# Patient Record
Sex: Female | Born: 1985 | Race: White | Hispanic: No | Marital: Married | Smoking: Never smoker
Health system: Southern US, Community
[De-identification: ages and names within clinical notes are randomized; demographics above are authoritative.]

## PROBLEM LIST (undated history)

## (undated) DIAGNOSIS — Q513 Bicornate uterus: Secondary | ICD-10-CM

## (undated) DIAGNOSIS — F988 Other specified behavioral and emotional disorders with onset usually occurring in childhood and adolescence: Secondary | ICD-10-CM

## (undated) DIAGNOSIS — D649 Anemia, unspecified: Secondary | ICD-10-CM

## (undated) DIAGNOSIS — O24419 Gestational diabetes mellitus in pregnancy, unspecified control: Secondary | ICD-10-CM

## (undated) HISTORY — DX: Other specified behavioral and emotional disorders with onset usually occurring in childhood and adolescence: F98.8

## (undated) HISTORY — DX: Gestational diabetes mellitus in pregnancy, unspecified control: O24.419

## (undated) HISTORY — DX: Bicornate uterus: Q51.3

## (undated) HISTORY — PX: BREAST SURGERY: SHX581

## (undated) HISTORY — DX: Anemia, unspecified: D64.9

## (undated) HISTORY — PX: CHOLECYSTECTOMY: SHX55

---
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
## 2006-04-28 ENCOUNTER — Other Ambulatory Visit: Admission: RE | Admit: 2006-04-28 | Discharge: 2006-04-28 | Payer: Self-pay | Admitting: Family Medicine

## 2007-09-16 ENCOUNTER — Other Ambulatory Visit: Admission: RE | Admit: 2007-09-16 | Discharge: 2007-09-16 | Payer: Self-pay | Admitting: Family Medicine

## 2012-07-07 DIAGNOSIS — F419 Anxiety disorder, unspecified: Secondary | ICD-10-CM | POA: Insufficient documentation

## 2013-07-26 DIAGNOSIS — O09219 Supervision of pregnancy with history of pre-term labor, unspecified trimester: Secondary | ICD-10-CM | POA: Insufficient documentation

## 2013-09-04 ENCOUNTER — Ambulatory Visit (INDEPENDENT_AMBULATORY_CARE_PROVIDER_SITE_OTHER): Payer: 59 | Admitting: Physician Assistant

## 2013-09-04 VITALS — BP 100/66 | HR 88 | Temp 97.7°F | Resp 20 | Ht 62.5 in | Wt 198.0 lb

## 2013-09-04 DIAGNOSIS — R82998 Other abnormal findings in urine: Secondary | ICD-10-CM

## 2013-09-04 DIAGNOSIS — R8290 Unspecified abnormal findings in urine: Secondary | ICD-10-CM

## 2013-09-04 DIAGNOSIS — R350 Frequency of micturition: Secondary | ICD-10-CM

## 2013-09-04 LAB — POCT WET PREP WITH KOH
KOH Prep POC: NEGATIVE
RBC Wet Prep HPF POC: NEGATIVE
Trichomonas, UA: NEGATIVE
Yeast Wet Prep HPF POC: NEGATIVE

## 2013-09-04 LAB — POCT URINALYSIS DIPSTICK
Bilirubin, UA: NEGATIVE
Blood, UA: NEGATIVE
Glucose, UA: NEGATIVE
Ketones, UA: NEGATIVE
Nitrite, UA: NEGATIVE
Protein, UA: NEGATIVE
Spec Grav, UA: 1.015
Urobilinogen, UA: 2
pH, UA: 7.5

## 2013-09-04 LAB — POCT UA - MICROSCOPIC ONLY
Casts, Ur, LPF, POC: NEGATIVE
Crystals, Ur, HPF, POC: NEGATIVE
Mucus, UA: NEGATIVE
RBC, urine, microscopic: NEGATIVE
Yeast, UA: NEGATIVE

## 2013-09-04 MED ORDER — CEPHALEXIN 500 MG PO CAPS: 500.0000 mg | ORAL_CAPSULE | Freq: Two times a day (BID) | ORAL | Status: DC

## 2013-09-04 NOTE — Progress Notes (Signed)
Subjective:    Patient ID: Otis DialsLisa Speakman, female    DOB: 07/23/1985, 28 y.o.   MRN: [id number]  HPI   Ms. Lawernce IonCranford is a very pleasant 28 yr old female here with concern for UTI.  Symptoms began today - frequency, strong odor to urine, lower abd pain.  She denies dysuria or hematuria.  She has had UTIs in the past.  She took two home UTI tests which were both positive.  +nausea but no vomiting.  No FC.  She also notes some vaginal itching and increased vaginal discharge.  Monogamous with husband, no concern for STI.  She is [redacted] wks pregnant    Review of Systems  Constitutional: Negative for fever and chills.  Respiratory: Negative.   Gastrointestinal: Positive for nausea and abdominal pain (lower). Negative for vomiting.  Genitourinary: Positive for frequency and vaginal discharge. Negative for dysuria and flank pain.  Musculoskeletal: Negative.   Skin: Negative.        Objective:   Physical Exam  Vitals reviewed. Constitutional: She is oriented to person, place, and time. She appears well-developed and well-nourished. No distress.  HENT:  Head: Normocephalic and atraumatic.  Eyes: Conjunctivae are normal. No scleral icterus.  Cardiovascular: Normal rate, regular rhythm and normal heart sounds.   Pulmonary/Chest: Effort normal and breath sounds normal. She has no wheezes. She has no rales.  Abdominal: There is no CVA tenderness.  Neurological: She is alert and oriented to person, place, and time.  Skin: Skin is warm and dry.  Psychiatric: She has a normal mood and affect. Her behavior is normal.    Results for orders placed in visit on 09/04/13  POCT URINALYSIS DIPSTICK      Result Value Ref Range   Color, UA yellow     Clarity, UA cloudy     Glucose, UA neg     Bilirubin, UA neg     Ketones, UA neg     Spec Grav, UA 1.015     Blood, UA neg     pH, UA 7.5     Protein, UA neg     Urobilinogen, UA 2.0     Nitrite, UA neg     Leukocytes, UA Trace    POCT UA - MICROSCOPIC  ONLY      Result Value Ref Range   WBC, Ur, HPF, POC 0-3     RBC, urine, microscopic neg     Bacteria, U Microscopic trace     Mucus, UA neg     Epithelial cells, urine per micros 1-3     Crystals, Ur, HPF, POC neg     Casts, Ur, LPF, POC neg     Yeast, UA neg    POCT WET PREP WITH KOH      Result Value Ref Range   Trichomonas, UA Negative     Clue Cells Wet Prep HPF POC 0-3     Epithelial Wet Prep HPF POC 1-6     Yeast Wet Prep HPF POC neg     Bacteria Wet Prep HPF POC 1+     RBC Wet Prep HPF POC neg     WBC Wet Prep HPF POC 2-6     KOH Prep POC Negative         Assessment & Plan:  Urinary frequency - Plan: POCT Urinalysis Dipstick, POCT UA - Microscopic Only, POCT Wet Prep with KOH, Urine culture, cephALEXin (KEFLEX) 500 MG capsule  Abnormal urine odor - Plan: POCT Wet Prep with  KOH, Urine culture, cephALEXin (KEFLEX) 500 MG capsule    Ms. Douse is a very pleasant 28 yr old female here with urinary frequency and abnormal urine odor which began today.  Concerned for UTI as two home tests were positive.  UA here is not very impressive for infection, but symptoms just began today.  Self collected wet prep with few clues but otherwise unimpressive.  Will go ahead and treat empirically with Keflex for UTI.  Cx pending - will adjust or d/c therapy when results available  Pt to call or RTC if worsening or not improving  E. Frances Furbish MHS, PA-C [facility] 7/11/20155:16 PM

## 2013-09-04 NOTE — Patient Instructions (Signed)
Start the cephalexin (Keflex) today.  I will let you know when your culture results are back.    If any symptoms are worsening or not improving, please let us  Know   Urinary Tract Infection Urinary tract infections (UTIs) can develop anywhere along your urinary tract. Your urinary tract is your body's drainage system for removing wastes and extra water. Your urinary tract includes two kidneys, two ureters, a bladder, and a urethra. Your kidneys are a pair of bean-shaped organs. Each kidney is about the size of your fist. They are located below your ribs, one on each side of your spine. CAUSES Infections are caused by microbes, which are microscopic organisms, including fungi, viruses, and bacteria. These organisms are so small that they can only be seen through a microscope. Bacteria are the microbes that most commonly cause UTIs. SYMPTOMS  Symptoms of UTIs may vary by age and gender of the patient and by the location of the infection. Symptoms in young women typically include a frequent and intense urge to urinate and a painful, burning feeling in the bladder or urethra during urination. Older women and men are more likely to be tired, shaky, and weak and have muscle aches and abdominal pain. A fever may mean the infection is in your kidneys. Other symptoms of a kidney infection include pain in your back or sides below the ribs, nausea, and vomiting. DIAGNOSIS To diagnose a UTI, your caregiver will ask you about your symptoms. Your caregiver also will ask to provide a urine sample. The urine sample will be tested for bacteria and white blood cells. White blood cells are made by your body to help fight infection. TREATMENT  Typically, UTIs can be treated with medication. Because most UTIs are caused by a bacterial infection, they usually can be treated with the use of antibiotics. The choice of antibiotic and length of treatment depend on your symptoms and the type of bacteria causing your  infection. HOME CARE INSTRUCTIONS  If you were prescribed antibiotics, take them exactly as your caregiver instructs you. Finish the medication even if you feel better after you have only taken some of the medication.  Drink enough water and fluids to keep your urine clear or pale yellow.  Avoid caffeine, tea, and carbonated beverages. They tend to irritate your bladder.  Empty your bladder often. Avoid holding urine for long periods of time.  Empty your bladder before and after sexual intercourse.  After a bowel movement, women should cleanse from front to back. Use each tissue only once. SEEK MEDICAL CARE IF:   You have back pain.  You develop a fever.  Your symptoms do not begin to resolve within 3 days. SEEK IMMEDIATE MEDICAL CARE IF:   You have severe back pain or lower abdominal pain.  You develop chills.  You have nausea or vomiting.  You have continued burning or discomfort with urination. MAKE SURE YOU:   Understand these instructions.  Will watch your condition.  Will get help right away if you are not doing well or get worse. Document Released: 11/21/2004 Document Revised: 08/13/2011 Document Reviewed: 03/22/2011 [facility] Patient Information 2015 [place]. This information is not intended to replace advice given to you by your health care provider. Make sure you discuss any questions you have with your health care provider.

## 2013-09-06 LAB — URINE CULTURE: Colony Count: 40000

## 2013-11-08 DIAGNOSIS — O2343 Unspecified infection of urinary tract in pregnancy, third trimester: Secondary | ICD-10-CM | POA: Insufficient documentation

## 2014-02-21 DIAGNOSIS — O3403 Maternal care for unspecified congenital malformation of uterus, third trimester: Secondary | ICD-10-CM | POA: Insufficient documentation

## 2014-02-21 DIAGNOSIS — Q513 Bicornate uterus: Secondary | ICD-10-CM | POA: Insufficient documentation

## 2015-06-05 LAB — HEPATIC FUNCTION PANEL
ALT: 26 (ref 7–35)
AST: 22 (ref 13–35)

## 2015-06-05 LAB — VITAMIN D 25 HYDROXY (VIT D DEFICIENCY, FRACTURES): Vit D, 25-Hydroxy: 35.6

## 2015-07-03 LAB — BASIC METABOLIC PANEL
BUN: 7 (ref 4–21)
Creatinine: 0.6 (ref 0.5–1.1)
Glucose: 87
Potassium: 4.6 (ref 3.4–5.3)
Sodium: 142 (ref 137–147)

## 2015-07-03 LAB — CBC AND DIFFERENTIAL
HCT: 37 (ref 36–46)
Hemoglobin: 12.2 (ref 12.0–16.0)
Platelets: 290 (ref 150–399)
WBC: 8.8

## 2015-07-03 LAB — HEPATIC FUNCTION PANEL
ALT: 37 — AB (ref 7–35)
AST: 22 (ref 13–35)
Alkaline Phosphatase: 57 (ref 25–125)
Bilirubin, Total: 0.2

## 2016-05-31 LAB — HEPATIC FUNCTION PANEL
ALT: 37 — AB (ref 7–35)
AST: 29 (ref 13–35)
Alkaline Phosphatase: 62 (ref 25–125)
Bilirubin, Total: 0.2

## 2016-05-31 LAB — BASIC METABOLIC PANEL
BUN: 13 (ref 4–21)
Creatinine: 0.7 (ref 0.5–1.1)
Glucose: 90
Potassium: 4.1 (ref 3.4–5.3)
Sodium: 140 (ref 137–147)

## 2016-05-31 LAB — LIPID PANEL
Cholesterol: 144 (ref 0–200)
HDL: 39 (ref 35–70)
LDL Cholesterol: 67
Triglycerides: 188 — AB (ref 40–160)

## 2016-05-31 LAB — CBC AND DIFFERENTIAL
HCT: 38 (ref 36–46)
Hemoglobin: 12.6 (ref 12.0–16.0)
Platelets: 339 (ref 150–399)
WBC: 10.6

## 2017-08-01 ENCOUNTER — Telehealth: Payer: Self-pay | Admitting: Obstetrics and Gynecology

## 2017-08-01 ENCOUNTER — Encounter: Payer: Self-pay | Admitting: Family Medicine

## 2017-08-01 ENCOUNTER — Ambulatory Visit (INDEPENDENT_AMBULATORY_CARE_PROVIDER_SITE_OTHER): Payer: BLUE CROSS/BLUE SHIELD | Admitting: Family Medicine

## 2017-08-01 VITALS — BP 121/80 | HR 89 | Ht 62.0 in | Wt 205.0 lb

## 2017-08-01 DIAGNOSIS — E559 Vitamin D deficiency, unspecified: Secondary | ICD-10-CM

## 2017-08-01 DIAGNOSIS — Z803 Family history of malignant neoplasm of breast: Secondary | ICD-10-CM | POA: Diagnosis not present

## 2017-08-01 DIAGNOSIS — N883 Incompetence of cervix uteri: Secondary | ICD-10-CM | POA: Insufficient documentation

## 2017-08-01 DIAGNOSIS — E669 Obesity, unspecified: Secondary | ICD-10-CM

## 2017-08-01 DIAGNOSIS — F39 Unspecified mood [affective] disorder: Secondary | ICD-10-CM | POA: Insufficient documentation

## 2017-08-01 DIAGNOSIS — F909 Attention-deficit hyperactivity disorder, unspecified type: Secondary | ICD-10-CM | POA: Diagnosis not present

## 2017-08-01 DIAGNOSIS — Z8249 Family history of ischemic heart disease and other diseases of the circulatory system: Secondary | ICD-10-CM

## 2017-08-01 DIAGNOSIS — Z975 Presence of (intrauterine) contraceptive device: Secondary | ICD-10-CM

## 2017-08-01 DIAGNOSIS — Z8632 Personal history of gestational diabetes: Secondary | ICD-10-CM | POA: Insufficient documentation

## 2017-08-01 DIAGNOSIS — E66812 Obesity, class 2: Secondary | ICD-10-CM

## 2017-08-01 HISTORY — DX: Incompetence of cervix uteri: N88.3

## 2017-08-01 MED ORDER — AMPHETAMINE-DEXTROAMPHETAMINE 15 MG PO TABS: ORAL_TABLET | ORAL | 0 refills | Status: DC

## 2017-08-01 MED ORDER — VITAMIN D (ERGOCALCIFEROL) 1.25 MG (50000 UNIT) PO CAPS: ORAL_CAPSULE | ORAL | 10 refills | Status: DC

## 2017-08-01 MED ORDER — ADDERALL XR 30 MG PO CP24: 30.0000 mg | ORAL_CAPSULE | Freq: Every day | ORAL | 0 refills | Status: DC

## 2017-08-01 MED ORDER — BUPROPION HCL ER (XL) 150 MG PO TB24: 150.0000 mg | ORAL_TABLET | Freq: Every day | ORAL | 0 refills | Status: DC

## 2017-08-01 NOTE — Patient Instructions (Addendum)
For more information you can go to:  [email or web address].fi  [email or web address].org/      Mediterranean Diet  Why follow it? Research shows. . Those who follow the Mediterranean diet have a reduced risk of heart disease  . The diet is associated with a reduced incidence of Parkinson's and Alzheimer's diseases . People following the diet may have longer life expectancies and lower rates of chronic diseases  . The Dietary Guidelines for Americans recommends the Mediterranean diet as an eating plan to promote health and prevent disease  What Is the Mediterranean Diet?  . Healthy eating plan based on typical foods and recipes of Mediterranean-style cooking . The diet is primarily a plant based diet; these foods should make up a majority of meals   Starches - Plant based foods should make up a majority of meals - They are an important sources of vitamins, minerals, energy, antioxidants, and fiber - Choose whole grains, foods high in fiber and minimally processed items  - Typical grain sources include wheat, oats, barley, corn, brown rice, bulgar, farro, millet, polenta, couscous  - Various types of beans include chickpeas, lentils, fava beans, black beans, white beans   Fruits  Veggies - Large quantities of antioxidant rich fruits & veggies; 6 or more servings  - Vegetables can be eaten raw or lightly drizzled with oil and cooked  - Vegetables common to the traditional Mediterranean Diet include: artichokes, arugula, beets, broccoli, brussel sprouts, cabbage, carrots, celery, collard greens, cucumbers, eggplant, kale, leeks, lemons, lettuce, mushrooms, okra, onions, peas, peppers, potatoes, pumpkin, radishes, rutabaga, shallots, spinach, sweet potatoes, turnips, zucchini - Fruits common to the Mediterranean Diet include: apples, apricots, avocados, cherries, clementines, dates, figs, grapefruits, grapes, melons, nectarines, oranges, peaches, pears, pomegranates,  strawberries, tangerines  Fats - Replace butter and margarine with healthy oils, such as olive oil, canola oil, and tahini  - Limit nuts to no more than a handful a day  - Nuts include walnuts, almonds, pecans, pistachios, pine nuts  - Limit or avoid candied, honey roasted or heavily salted nuts - Olives are central to the [employer] - can be eaten whole or used in a variety of dishes   Meats Protein - Limiting red meat: no more than a few times a month - When eating red meat: choose lean cuts and keep the portion to the size of deck of cards - Eggs: approx. 0 to 4 times a week  - Fish and lean poultry: at least 2 a week  - Healthy protein sources include, chicken, [place], lean beef, lamb - Increase intake of seafood such as tuna, salmon, trout, mackerel, shrimp, scallops - Avoid or limit high fat processed meats such as sausage and bacon  Dairy - Include moderate amounts of low fat dairy products  - Focus on healthy dairy such as fat free yogurt, skim milk, low or reduced fat cheese - Limit dairy products higher in fat such as whole or 2% milk, cheese, ice cream  Alcohol - Moderate amounts of red wine is ok  - No more than 5 oz daily for women (all ages) and men older than age 71  - No more than 10 oz of wine daily for men younger than 10  Other - Limit sweets and other desserts  - Use herbs and spices instead of salt to flavor foods  - Herbs and spices common to the traditional Mediterranean Diet include: basil, bay leaves, chives, cloves, cumin, fennel, garlic, lavender, marjoram, mint, oregano, parsley, pepper, rosemary, sage, savory,  sumac, tarragon, thyme   It's not just a diet, it's a lifestyle:  . The Mediterranean diet includes lifestyle factors typical of those in the region  . Foods, drinks and meals are best eaten with others and savored . Daily physical activity is important for overall good health . This could be strenuous exercise like running and aerobics . This  could also be more leisurely activities such as walking, housework, yard-work, or taking the stairs . Moderation is the key; a balanced and healthy diet accommodates most foods and drinks . Consider portion sizes and frequency of consumption of certain foods   Meal Ideas & Options:  . Breakfast:  o Whole wheat toast or whole wheat English muffins with peanut butter & hard boiled egg o Steel cut oats topped with apples & cinnamon and skim milk  o Fresh fruit: banana, strawberries, melon, berries, peaches  o Smoothies: strawberries, bananas, greek yogurt, peanut butter o Low fat greek yogurt with blueberries and granola  o Egg white omelet with spinach and mushrooms o Breakfast couscous: whole wheat couscous, apricots, skim milk, cranberries  . Sandwiches:  o Hummus and grilled vegetables (peppers, zucchini, squash) on whole wheat bread   o Grilled chicken on whole wheat pita with lettuce, tomatoes, cucumbers or tzatziki  o Tuna salad on whole wheat bread: tuna salad made with greek yogurt, olives, red peppers, capers, green onions o Garlic rosemary lamb pita: lamb sauted with garlic, rosemary, salt & pepper; add lettuce, cucumber, greek yogurt to pita - flavor with lemon juice and black pepper  . Seafood:  o Mediterranean grilled salmon, seasoned with garlic, basil, parsley, lemon juice and black pepper o Shrimp, lemon, and spinach whole-grain pasta salad made with low fat greek yogurt  o Seared scallops with lemon orzo  o Seared tuna steaks seasoned salt, pepper, coriander topped with tomato mixture of olives, tomatoes, olive oil, minced garlic, parsley, green onions and cappers  . Meats:  o Herbed greek chicken salad with kalamata olives, cucumber, feta  o Red bell peppers stuffed with spinach, bulgur, lean ground beef (or lentils) & topped with feta   o Kebabs: skewers of chicken, tomatoes, onions, zucchini, squash  o [place] burgers: made with red onions, mint, dill, lemon juice, feta  cheese topped with roasted red peppers . Vegetarian o Cucumber salad: cucumbers, artichoke hearts, celery, red onion, feta cheese, tossed in olive oil & lemon juice  o Hummus and whole grain pita points with a greek salad (lettuce, tomato, feta, olives, cucumbers, red onion) o Lentil soup with celery, carrots made with vegetable broth, garlic, salt and pepper  o Tabouli salad: parsley, bulgur, mint, scallions, cucumbers, tomato, radishes, lemon juice, olive oil, salt and pepper. Please realize, EXERCISE IS MEDICINE!  -  American Heart Association [facility]) guidelines for exercise : If you are in good health, without any medical conditions, you should engage in 150 minutes of moderate intensity aerobic activity per week.  This means you should be huffing and puffing throughout your workout.   Engaging in regular exercise will improve brain function and memory, as well as improve mood, boost immune system and help with weight management.  As well as the other, more well-known effects of exercise such as decreasing blood sugar levels, decreasing blood pressure,  and decreasing bad cholesterol levels/ increasing good cholesterol levels.     -  The AHA strongly endorses consumption of a diet that contains a variety of foods from all the food categories with an emphasis  on fruits and vegetables; fat-free and low-fat dairy products; cereal and grain products; legumes and nuts; and fish, poultry, and/or extra lean meats.    Excessive food intake, especially of foods high in saturated and trans fats, sugar, and salt, should be avoided.    Adequate water intake of roughly 1/2 of your weight in pounds, should equal the ounces of water per day you should drink.  So for instance, if you're 200 pounds, that would be 100 ounces of water per day.         Mediterranean Diet  Why follow it? Research shows. . Those who follow the Mediterranean diet have a reduced risk of heart disease  . The diet is associated with a  reduced incidence of Parkinson's and Alzheimer's diseases . People following the diet may have longer life expectancies and lower rates of chronic diseases  . The Dietary Guidelines for Americans recommends the Mediterranean diet as an eating plan to promote health and prevent disease  What Is the Mediterranean Diet?  . Healthy eating plan based on typical foods and recipes of Mediterranean-style cooking . The diet is primarily a plant based diet; these foods should make up a majority of meals   Starches - Plant based foods should make up a majority of meals - They are an important sources of vitamins, minerals, energy, antioxidants, and fiber - Choose whole grains, foods high in fiber and minimally processed items  - Typical grain sources include wheat, oats, barley, corn, brown rice, bulgar, farro, millet, polenta, couscous  - Various types of beans include chickpeas, lentils, fava beans, black beans, white beans   Fruits  Veggies - Large quantities of antioxidant rich fruits & veggies; 6 or more servings  - Vegetables can be eaten raw or lightly drizzled with oil and cooked  - Vegetables common to the traditional Mediterranean Diet include: artichokes, arugula, beets, broccoli, brussel sprouts, cabbage, carrots, celery, collard greens, cucumbers, eggplant, kale, leeks, lemons, lettuce, mushrooms, okra, onions, peas, peppers, potatoes, pumpkin, radishes, rutabaga, shallots, spinach, sweet potatoes, turnips, zucchini - Fruits common to the Mediterranean Diet include: apples, apricots, avocados, cherries, clementines, dates, figs, grapefruits, grapes, melons, nectarines, oranges, peaches, pears, pomegranates, strawberries, tangerines  Fats - Replace butter and margarine with healthy oils, such as olive oil, canola oil, and tahini  - Limit nuts to no more than a handful a day  - Nuts include walnuts, almonds, pecans, pistachios, pine nuts  - Limit or avoid candied, honey roasted or heavily salted  nuts - Olives are central to the [employer] - can be eaten whole or used in a variety of dishes   Meats Protein - Limiting red meat: no more than a few times a month - When eating red meat: choose lean cuts and keep the portion to the size of deck of cards - Eggs: approx. 0 to 4 times a week  - Fish and lean poultry: at least 2 a week  - Healthy protein sources include, chicken, [place], lean beef, lamb - Increase intake of seafood such as tuna, salmon, trout, mackerel, shrimp, scallops - Avoid or limit high fat processed meats such as sausage and bacon  Dairy - Include moderate amounts of low fat dairy products  - Focus on healthy dairy such as fat free yogurt, skim milk, low or reduced fat cheese - Limit dairy products higher in fat such as whole or 2% milk, cheese, ice cream  Alcohol - Moderate amounts of red wine is ok  - No  more than 5 oz daily for women (all ages) and men older than age 33  - No more than 10 oz of wine daily for men younger than 2965  Other - Limit sweets and other desserts  - Use herbs and spices instead of salt to flavor foods  - Herbs and spices common to the traditional Mediterranean Diet include: basil, bay leaves, chives, cloves, cumin, fennel, garlic, lavender, marjoram, mint, oregano, parsley, pepper, rosemary, sage, savory, sumac, tarragon, thyme   It's not just a diet, it's a lifestyle:  . The Mediterranean diet includes lifestyle factors typical of those in the region  . Foods, drinks and meals are best eaten with others and savored . Daily physical activity is important for overall good health . This could be strenuous exercise like running and aerobics . This could also be more leisurely activities such as walking, housework, yard-work, or taking the stairs . Moderation is the key; a balanced and healthy diet accommodates most foods and drinks . Consider portion sizes and frequency of consumption of certain foods   Meal Ideas & Options:   . Breakfast:  o Whole wheat toast or whole wheat English muffins with peanut butter & hard boiled egg o Steel cut oats topped with apples & cinnamon and skim milk  o Fresh fruit: banana, strawberries, melon, berries, peaches  o Smoothies: strawberries, bananas, greek yogurt, peanut butter o Low fat greek yogurt with blueberries and granola  o Egg white omelet with spinach and mushrooms o Breakfast couscous: whole wheat couscous, apricots, skim milk, cranberries  . Sandwiches:  o Hummus and grilled vegetables (peppers, zucchini, squash) on whole wheat bread   o Grilled chicken on whole wheat pita with lettuce, tomatoes, cucumbers or tzatziki  o Tuna salad on whole wheat bread: tuna salad made with greek yogurt, olives, red peppers, capers, green onions o Garlic rosemary lamb pita: lamb sauted with garlic, rosemary, salt & pepper; add lettuce, cucumber, greek yogurt to pita - flavor with lemon juice and black pepper  . Seafood:  o Mediterranean grilled salmon, seasoned with garlic, basil, parsley, lemon juice and black pepper o Shrimp, lemon, and spinach whole-grain pasta salad made with low fat greek yogurt  o Seared scallops with lemon orzo  o Seared tuna steaks seasoned salt, pepper, coriander topped with tomato mixture of olives, tomatoes, olive oil, minced garlic, parsley, green onions and cappers  . Meats:  o Herbed greek chicken salad with kalamata olives, cucumber, feta  o Red bell peppers stuffed with spinach, bulgur, lean ground beef (or lentils) & topped with feta   o Kebabs: skewers of chicken, tomatoes, onions, zucchini, squash  o [place] burgers: made with red onions, mint, dill, lemon juice, feta cheese topped with roasted red peppers . Vegetarian o Cucumber salad: cucumbers, artichoke hearts, celery, red onion, feta cheese, tossed in olive oil & lemon juice  o Hummus and whole grain pita points with a greek salad (lettuce, tomato, feta, olives, cucumbers, red onion) o Lentil  soup with celery, carrots made with vegetable broth, garlic, salt and pepper  o Tabouli salad: parsley, bulgur, mint, scallions, cucumbers, tomato, radishes, lemon juice, olive oil, salt and pepper.

## 2017-08-01 NOTE — Telephone Encounter (Signed)
Called and left a message for patient to call back to schedule a new patient doctor referral appointment with our office to see Dr. Hyacinth MeekerMiller for Dr. Oscar LaJertson for routine female care.

## 2017-08-01 NOTE — Progress Notes (Signed)
New patient office visit note:  Impression and Recommendations:    1. Attention deficit hyperactivity disorder (ADHD), unspecified ADHD type   2. Class 2 obesity with body mass index (BMI) of 35 to 39.9 without comorbidity   3. Vitamin D deficiency   4. Family history of early CAD   5. Family history of breast cancer-  paternal aunt in 1340s   6. IUD (intrauterine device) in place     1 ADHD  -Check labs in near future.   -start adderall 15 mg for evenings to use in addition to adderall 30 mg XR qd.   -refills given.     2. class 2 obesity -recommend losing weight. Encouraged regular exercise.  3. Vit D -refill given for ergocalciferol twice weekly.   4. FMHx MI father age 50s/CAD -will continue to monitor  5. FMHx breast CA -Referral given to obgyn.    Education and routine counseling performed. Handouts provided.  Orders Placed This Encounter  Procedures  . Ambulatory referral to Gynecology    Meds ordered this encounter  Medications  . Vitamin D, Ergocalciferol, (DRISDOL) 50000 units CAPS capsule    Sig: Take 1 capsule every Wednesday and Sunday wkly    Dispense:  24 capsule    Refill:  10  . ADDERALL XR 30 MG 24 hr capsule    Sig: Take 1 capsule (30 mg total) by mouth daily.    Dispense:  30 capsule    Refill:  0  . buPROPion (WELLBUTRIN XL) 150 MG 24 hr tablet    Sig: Take 1 tablet (150 mg total) by mouth daily.    Dispense:  30 tablet    Refill:  0  . amphetamine-dextroamphetamine (ADDERALL) 15 MG tablet    Sig: Daily at around 1 PM    Dispense:  30 tablet    Refill:  0    Gross side effects, risk and benefits, and alternatives of medications discussed with patient.  Patient is aware that all medications have potential side effects and we are unable to predict every side effect or drug-drug interaction that may occur.  Expresses verbal understanding and consents to current therapy plan and treatment regimen.  Return for Near future for  complete physical exam, come fasting.  Please see AVS handed out to patient at the end of our visit for further patient instructions/ counseling done pertaining to today's office visit.    Note: This document was prepared using Dragon voice recognition software and may include unintentional dictation errors.  This document serves as a record of services personally performed by Thomasene Loteborah Zohair Epp, DO. It was created on her behalf by Thelma BargeNick Cochran, a trained medical scribe. The creation of this record is based on the scribe's personal observations and the provider's statements to them.   I have reviewed the above medical documentation for accuracy and completeness and I concur.  Thomasene LotDeborah Lindley Stachnik 08/03/17 10:21 PM  ----------------------------------------------------------------------------------------------------------------------    Subjective:    Chief complaint:   Chief Complaint  Patient presents with  . Establish Care     HPI: Joanna Campos is a pleasant 32 y.o. female who [facility] to review their medical history with me and establish care.   I asked the patient to review their chronic problem list with me to ensure everything was updated and accurate.    All recent office visits with other providers, any medical records that patient brought in etc  -  I reviewed today.     We asked pt to get [place] their medical records from [facility] providers/ specialists that they had seen within the past 3-5 years- if they are in private practice and/or do not work for [employer], [facility], [facility], [facility] or [facility] owned practice.  Told them to call their specialists to clarify this if they are not sure.   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 She is married, her husband,, [name] was an Gaffer. He now works a job at night at [employer], and another job during the day.   She has two children, 3  and 7.   She is originally from the area, but lived in [place] for the past 5 years.   She is getting new health insurance at the end of the month.   Other providers [facility] in [place]  No obgyn in [place]. She has an IUD implanted.  PMHx ADHD- on adderall. Started meds in high school.  Denies low mood swings. The generic adderall did not work. She has a therapist she sees. She has taken 45 mg XR before which made her a "zombie".  Depression/anxiety- has on wellbutrin in the past which improved her mood.   She walks 4 times a week, 30 minutes a day.   Gestational diabetes: during 1st pregnancy.  Vit D deficiency- she was taking ergocalciferol 50000 IUs twice a week.    FMHx Father-MI age 57. She states he had negative stress test recently before his MI.  Paternal aunt- age 57s Breast CA.  PSHx SHx See below   Wt Readings from Last 3 Encounters:  08/01/17 205 lb (93 kg)  09/04/13 198 lb (89.8 kg)   BP Readings from Last 3 Encounters:  08/01/17 121/80  09/04/13 100/66   Pulse Readings from Last 3 Encounters:  08/01/17 89  09/04/13 88   BMI Readings from Last 3 Encounters:  08/01/17 37.49 kg/m  09/04/13 35.64 kg/m    Patient Care Team    Relationship Specialty Notifications Start End  Thomasene Lot, DO PCP - General Family Medicine  07/25/17     Patient Active Problem List   Diagnosis Date Noted  . Hx gestational diabetes 08/01/2017  . Incompetent cervix 08/01/2017  . Mood disorder (HCC) 08/01/2017  . Attention deficit hyperactivity disorder (ADHD) 08/01/2017  . Class 2 obesity with body mass index (BMI) of 35 to 39.9 without comorbidity 08/01/2017  . Vitamin D deficiency 08/01/2017  . Family history of breast cancer-  paternal aunt in 26s 08/01/2017  . Family history of early CAD 08/01/2017  . IUD (intrauterine device) in place 08/01/2017  . Bicornuate uterus affecting pregnancy, antepartum, third trimester 02/21/2014  . Recurrent  urinary tract infection affecting pregnancy in third trimester 11/08/2013  . Previous preterm labor affecting pregnancy, antepartum 07/26/2013  . Anxiety 07/07/2012     No past medical history on file.   No past medical history on file.   Past Surgical History:  Procedure Laterality Date  . CHOLECYSTECTOMY       Family History  Problem Relation Age of Onset  . Heart disease Father   . Stroke Maternal Grandfather      Social History   Substance and Sexual Activity  Drug Use No     Social History   Substance and Sexual Activity  Alcohol Use No     Social History   Tobacco Use  Smoking Status Never  Smoker  Smokeless Tobacco Never Used     Current Meds  Medication Sig  . ADDERALL XR 30 MG 24 hr capsule Take 1 capsule (30 mg total) by mouth daily.  Marland Kitchen buPROPion (WELLBUTRIN XL) 150 MG 24 hr tablet Take 1 tablet (150 mg total) by mouth daily.  . [DISCONTINUED] ADDERALL XR 30 MG 24 hr capsule Take 1 capsule by mouth daily.  . [DISCONTINUED] buPROPion (WELLBUTRIN XL) 150 MG 24 hr tablet Take 1 tablet by mouth daily.    Allergies: Codeine   Review of Systems  Constitutional: Negative for chills, diaphoresis, fever, malaise/fatigue and weight loss.  HENT: Negative for congestion, sore throat and tinnitus.   Eyes: Negative for blurred vision, double vision and photophobia.  Respiratory: Negative for cough and wheezing.   Cardiovascular: Negative for chest pain and palpitations.  Gastrointestinal: Negative for blood in stool, diarrhea, nausea and vomiting.  Genitourinary: Negative for dysuria, frequency and urgency.  Musculoskeletal: Negative for joint pain and myalgias.  Skin: Negative for itching and rash.  Neurological: Negative for dizziness, focal weakness, weakness and headaches.  Endo/Heme/Allergies: Negative for environmental allergies and polydipsia. Does not bruise/bleed easily.  Psychiatric/Behavioral: Negative for depression and memory loss. The  patient is not nervous/anxious and does not have insomnia.      Objective:   Blood pressure 121/80, pulse 89, height 5\' 2"  (1.575 m), weight 205 lb (93 kg), SpO2 98 %. Body mass index is 37.49 kg/m. General: Well Developed, well nourished, and in no acute distress.  Neuro: Alert and oriented x3, extra-ocular muscles intact, sensation grossly intact.  HEENT:[place]/AT, PERRLA, neck supple, No carotid bruits Skin: no gross rashes  Cardiac: Regular rate and rhythm Respiratory: Essentially clear to auscultation bilaterally. Not using accessory muscles, speaking in full sentences.  Abdominal: not grossly distended Musculoskeletal: Ambulates w/o diff, FROM * 4 ext.  Vasc: less 2 sec cap RF, warm and pink  Psych:  No HI/SI, judgement and insight good, Euthymic mood. Full Affect.    No results found for this or any previous visit (from the past 2160 hour(s)).

## 2017-08-21 ENCOUNTER — Other Ambulatory Visit: Payer: Self-pay | Admitting: Family Medicine

## 2017-08-22 NOTE — Telephone Encounter (Signed)
Medication [facility] at [facility]/17 for #30 no refills.  Patient's next appointment is on 09/12/2017.  Please review and advise. MPulliam, CMA/RT(R)

## 2017-08-25 NOTE — Telephone Encounter (Signed)
Please see if patient's insurance does 90-day supply.   Per last note patient was going to come in in the near future for a yearly physical and apparently did not do so;  so that is why she was not given additional medication RF's.    Also, a new dosing regimen was given to patient, so please call her and ask her how the new long-acting along with short acting is working out for her.   Please note if she is having any side effects or concerns; if she is not, please give her one 90-day supply and no refills.     We told her she needs to come in every 3 months for refills of this controlled substance.  Thanks!

## 2017-08-26 MED ORDER — ADDERALL XR 30 MG PO CP24: 30.0000 mg | ORAL_CAPSULE | Freq: Every day | ORAL | 0 refills | Status: DC

## 2017-08-26 MED ORDER — AMPHETAMINE-DEXTROAMPHETAMINE 15 MG PO TABS: ORAL_TABLET | ORAL | 0 refills | Status: DC

## 2017-08-26 NOTE — Telephone Encounter (Signed)
Patient should change to half tablet of the short-acting around 1:00 in the afternoon instead of 1 full tablet to help her sleep at night.  New prescription reflecting this dose given to patient.  Tell patient she will need a follow-up office visit to assess this new dose and monitored her treatment regimen of her ADHD in addition to her yearly health maintenance exam she has coming up which is part of her preventative care.

## 2017-08-26 NOTE — Telephone Encounter (Signed)
Called patient she would like to do a 30 day to local pharmacy at this time.  She is coming in for her physical in July and wants to contact the insurance to see if this is a medication that she can do 90 day supply through the mail order.  Do you want to send in this refill or do you want me to print it for the patient to pick up? Patient states that the long and short acting meds together are working well but she is having trouble getting to sleep at time.  She takes the long acting at 6am and he short acting at 1pm patient is wondering if she needs to take them earlier or if the dose needs to be changed on the short acting, please advise.

## 2017-09-09 ENCOUNTER — Other Ambulatory Visit: Payer: Self-pay | Admitting: Family Medicine

## 2017-09-09 MED ORDER — BUPROPION HCL ER (XL) 150 MG PO TB24: 150.0000 mg | ORAL_TABLET | Freq: Every day | ORAL | 0 refills | Status: DC

## 2017-09-09 NOTE — Progress Notes (Signed)
32 y.o. [id number] MarriedCaucasianF here for annual exam.   She has a h/o pre-term labor and an incompetent cervix, was told she had a bicornuate uterus. Second pregnancy was fine, full term. She had a mirena IUD in 3/16. She hasn't gotten pregnant again.  She has occasional spotting and cramping. No dyspareunia.    No LMP recorded due to IUD.          Sexually active: Yes.    The current method of family planning is Mirena IUD placed in 2016 per patient.    Exercising: Yes.    walking Smoker:  no  Health Maintenance: Pap:  2016 WNL per patient History of abnormal Pap:  no MMG:  Never Colonoscopy:  Never TDaP:  6/17 per patient Gardasil: Never, declines.    reports that she has never smoked. She has never used smokeless tobacco. She reports that she does not drink alcohol or use drugs. 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   Past Medical History:  Diagnosis Date  . ADD (attention deficit disorder)   . Anemia   . Bicornate uterus   . Gestational diabetes     Past Surgical History:  Procedure Laterality Date  . BREAST SURGERY    . CHOLECYSTECTOMY    breast reduction  Current Outpatient Medications  Medication Sig Dispense Refill  . ADDERALL XR 30 MG 24 hr capsule Take 1 capsule (30 mg total) by mouth daily. 30 capsule 0  . amphetamine-dextroamphetamine (ADDERALL) 15 MG tablet One half tab daily at around 1 PM 15 tablet 0  . buPROPion (WELLBUTRIN XL) 150 MG 24 hr tablet Take 1 tablet (150 mg total) by mouth daily. 30 tablet 0  . levonorgestrel (MIRENA) 20 MCG/24HR IUD 1 each by Intrauterine route once.    . Vitamin D, Ergocalciferol, (DRISDOL) 50000 units CAPS capsule Take 1 capsule every Wednesday and Sunday wkly 24 capsule 10   No current facility-administered medications for this visit.     Family History  Problem Relation Age of Onset  . Heart disease Father   . Stroke Maternal Grandfather     Review of Systems  Constitutional: Negative.   HENT: Negative.    Eyes: Negative.   Respiratory: Negative.   Cardiovascular: Negative.   Gastrointestinal: Negative.   Endocrine: Negative.   Genitourinary: Negative.   Musculoskeletal: Negative.   Skin: Negative.   Allergic/Immunologic: Negative.   Neurological: Negative.   Hematological: Negative.   Psychiatric/Behavioral: Negative.     Exam:   BP 120/81 (BP Location: Right Arm, Patient Position: Sitting)   Pulse 64   Ht 5\' 3"  (1.6 m)   Wt 207 lb 9.6 oz (94.2 kg)   BMI 36.77 kg/m   Weight change: @WEIGHTCHANGE @ Height:   Height: 5\' 3"  (160 cm)  Ht Readings from Last 3 Encounters:  09/10/17 5\' 3"  (1.6 m)  08/01/17 5\' 2"  (1.575 m)  09/04/13 5' 2.5" (1.588 m)    General appearance: alert, cooperative and appears stated age Head: Normocephalic, without obvious abnormality, atraumatic Neck: no adenopathy, supple, symmetrical, trachea midline and thyroid normal to inspection and palpation Lungs: clear to auscultation bilaterally Cardiovascular: regular rate and rhythm Breasts: normal appearance, no masses or tenderness, evidence of breast reduction Abdomen: soft, non-tender; non distended,  no masses,  no organomegaly Extremities: extremities normal, atraumatic, no cyanosis or edema Skin: Skin color, texture, turgor normal. No rashes or lesions Lymph nodes: Cervical, supraclavicular, and axillary nodes normal. No abnormal inguinal nodes palpated Neurologic: Grossly normal  Pelvic: External genitalia:  no lesions              Urethra:  normal appearing urethra with no masses, tenderness or lesions              Bartholins and Skenes: normal                 Vagina: normal appearing vagina with normal color and discharge, no lesions              Cervix: no lesions and IUD string 3 cm               Bimanual Exam:  Uterus:  normal size, contour, position, consistency, mobility, non-tender and retroverted              Adnexa: no mass, fullness, tenderness               Rectovaginal: Confirms                Anus:  normal sphincter tone, no lesions  Chaperone was present for exam.  A:  Well Woman with normal exam  H/O gestational diabetes  H/o uterine anomaly  Has an IUD  P:   Pap with hpv  Labs with primary (including screening for diabetes)  Discussed breast self exam  Discussed calcium and vit D intake  Return for an ultrasound to determine uterine anomaly

## 2017-09-10 ENCOUNTER — Encounter: Payer: Self-pay | Admitting: Obstetrics and Gynecology

## 2017-09-10 ENCOUNTER — Ambulatory Visit: Payer: BLUE CROSS/BLUE SHIELD | Admitting: Obstetrics and Gynecology

## 2017-09-10 ENCOUNTER — Other Ambulatory Visit: Payer: Self-pay

## 2017-09-10 ENCOUNTER — Other Ambulatory Visit (HOSPITAL_COMMUNITY)
Admission: RE | Admit: 2017-09-10 | Discharge: 2017-09-10 | Disposition: A | Discharge status: Home / Self Care | Payer: BLUE CROSS/BLUE SHIELD | Source: Ambulatory Visit | Attending: Obstetrics and Gynecology | Admitting: Obstetrics and Gynecology

## 2017-09-10 VITALS — BP 120/81 | HR 64 | Ht 63.0 in | Wt 207.6 lb

## 2017-09-10 DIAGNOSIS — Z124 Encounter for screening for malignant neoplasm of cervix: Secondary | ICD-10-CM | POA: Insufficient documentation

## 2017-09-10 DIAGNOSIS — Z87718 Personal history of other specified (corrected) congenital malformations of genitourinary system: Secondary | ICD-10-CM | POA: Diagnosis not present

## 2017-09-10 DIAGNOSIS — Z01419 Encounter for gynecological examination (general) (routine) without abnormal findings: Secondary | ICD-10-CM | POA: Diagnosis not present

## 2017-09-10 DIAGNOSIS — Z30431 Encounter for routine checking of intrauterine contraceptive device: Secondary | ICD-10-CM | POA: Diagnosis not present

## 2017-09-10 DIAGNOSIS — O24419 Gestational diabetes mellitus in pregnancy, unspecified control: Secondary | ICD-10-CM | POA: Diagnosis not present

## 2017-09-10 NOTE — Patient Instructions (Signed)
EXERCISE AND DIET:  We recommended that you start or continue a regular exercise program for good health. Regular exercise means any activity that makes your heart beat faster and makes you sweat.  We recommend exercising at least 30 minutes per day at least 3 days a week, preferably 4 or 5.  We also recommend a diet low in fat and sugar.  Inactivity, poor dietary choices and obesity can cause diabetes, heart attack, stroke, and kidney damage, among others.    ALCOHOL AND SMOKING:  Women should limit their alcohol intake to no more than 7 drinks/beers/glasses of wine (combined, not each!) per week. Moderation of alcohol intake to this level decreases your risk of breast cancer and liver damage. And of course, no recreational drugs are part of a healthy lifestyle.  And absolutely no smoking or even second hand smoke. Most people know smoking can cause heart and lung diseases, but did you know it also contributes to weakening of your bones? Aging of your skin?  Yellowing of your teeth and nails?  CALCIUM AND VITAMIN D:  Adequate intake of calcium and Vitamin D are recommended.  The recommendations for exact amounts of these supplements seem to change often, but generally speaking 600 mg of calcium (either carbonate or citrate) and 800 units of Vitamin D per day seems prudent. Certain women may benefit from higher intake of Vitamin D.  If you are among these women, your doctor will have told you during your visit.    PAP SMEARS:  Pap smears, to check for cervical cancer or precancers,  have traditionally been done yearly, although recent scientific advances have shown that most women can have pap smears less often.  However, every woman still should have a physical exam from her gynecologist every year. It will include a breast check, inspection of the vulva and vagina to check for abnormal growths or skin changes, a visual exam of the cervix, and then an exam to evaluate the size and shape of the uterus and  ovaries.  And after 33 years of age, a rectal exam is indicated to check for rectal cancers. We will also provide age appropriate advice regarding health maintenance, like when you should have certain vaccines, screening for sexually transmitted diseases, bone density testing, colonoscopy, mammograms, etc.   MAMMOGRAMS:  All women over 33 years old should have a yearly mammogram. Many facilities now offer a "3D" mammogram, which may cost around $50 extra out of pocket. If possible,  we recommend you accept the option to have the 3D mammogram performed.  It both reduces the number of women who will be called back for extra views which then turn out to be normal, and it is better than the routine mammogram at detecting truly abnormal areas.    COLONOSCOPY:  Colonoscopy to screen for colon cancer is recommended for all women at age 50.  We know, you hate the idea of the prep.  We agree, BUT, having colon cancer and not knowing it is worse!!  Colon cancer so often starts as a polyp that can be seen and removed at colonscopy, which can quite literally save your life!  And if your first colonoscopy is normal and you have no family history of colon cancer, most women don't have to have it again for 10 years.  Once every ten years, you can do something that may end up saving your life, right?  We will be happy to help you get it scheduled when you are ready.    Be sure to check your insurance coverage so you understand how much it will cost.  It may be covered as a preventative service at no cost, but you should check your particular policy.      Breast Self-Awareness Breast self-awareness means being familiar with how your breasts look and feel. It involves checking your breasts regularly and reporting any changes to your health care provider. Practicing breast self-awareness is important. A change in your breasts can be a sign of a serious medical problem. Being familiar with how your breasts look and feel allows  you to find any problems early, when treatment is more likely to be successful. All women should practice breast self-awareness, including women who have had breast implants. How to do a breast self-exam One way to learn what is normal for your breasts and whether your breasts are changing is to do a breast self-exam. To do a breast self-exam: Look for Changes  1. Remove all the clothing above your waist. 2. Stand in front of a mirror in a room with good lighting. 3. Put your hands on your hips. 4. Push your hands firmly downward. 5. Compare your breasts in the mirror. Look for differences between them (asymmetry), such as: ? Differences in shape. ? Differences in size. ? Puckers, dips, and bumps in one breast and not the other. 6. Look at each breast for changes in your skin, such as: ? Redness. ? Scaly areas. 7. Look for changes in your nipples, such as: ? Discharge. ? Bleeding. ? Dimpling. ? Redness. ? A change in position. Feel for Changes  Carefully feel your breasts for lumps and changes. It is best to do this while lying on your back on the floor and again while sitting or standing in the shower or tub with soapy water on your skin. Feel each breast in the following way:  Place the arm on the side of the breast you are examining above your head.  Feel your breast with the other hand.  Start in the nipple area and make  inch (2 cm) overlapping circles to feel your breast. Use the pads of your three middle fingers to do this. Apply light pressure, then medium pressure, then firm pressure. The light pressure will allow you to feel the tissue closest to the skin. The medium pressure will allow you to feel the tissue that is a little deeper. The firm pressure will allow you to feel the tissue close to the ribs.  Continue the overlapping circles, moving downward over the breast until you feel your ribs below your breast.  Move one finger-width toward the center of the body.  Continue to use the  inch (2 cm) overlapping circles to feel your breast as you move slowly up toward your collarbone.  Continue the up and down exam using all three pressures until you reach your armpit.  Write Down What You Find  Write down what is normal for each breast and any changes that you find. Keep a written record with breast changes or normal findings for each breast. By writing this information down, you do not need to depend only on memory for size, tenderness, or location. Write down where you are in your menstrual cycle, if you are still menstruating. If you are having trouble noticing differences in your breasts, do not get discouraged. With time you will become more familiar with the variations in your breasts and more comfortable with the exam. How often should I examine my breasts? Examine   your breasts every month. If you are breastfeeding, the best time to examine your breasts is after a feeding or after using a breast pump. If you menstruate, the best time to examine your breasts is 5-7 days after your period is over. During your period, your breasts are lumpier, and it may be more difficult to notice changes. When should I see my health care provider? See your health care provider if you notice:  A change in shape or size of your breasts or nipples.  A change in the skin of your breast or nipples, such as a reddened or scaly area.  Unusual discharge from your nipples.  A lump or thick area that was not there before.  Pain in your breasts.  Anything that concerns you.  This information is not intended to replace advice given to you by your health care provider. Make sure you discuss any questions you have with your health care provider. Document Released: 02/11/2005 Document Revised: 07/20/2015 Document Reviewed: 01/01/2015 Elsevier Interactive Patient Education  2018 [employer]  

## 2017-09-12 ENCOUNTER — Other Ambulatory Visit: Payer: Self-pay

## 2017-09-12 ENCOUNTER — Encounter: Payer: Self-pay | Admitting: Family Medicine

## 2017-09-12 ENCOUNTER — Telehealth: Payer: Self-pay | Admitting: Obstetrics and Gynecology

## 2017-09-12 ENCOUNTER — Ambulatory Visit (INDEPENDENT_AMBULATORY_CARE_PROVIDER_SITE_OTHER): Payer: BLUE CROSS/BLUE SHIELD | Admitting: Family Medicine

## 2017-09-12 VITALS — BP 107/73 | HR 65 | Ht 62.0 in | Wt 208.8 lb

## 2017-09-12 DIAGNOSIS — E669 Obesity, unspecified: Secondary | ICD-10-CM | POA: Diagnosis not present

## 2017-09-12 DIAGNOSIS — Z0000 Encounter for general adult medical examination without abnormal findings: Secondary | ICD-10-CM

## 2017-09-12 DIAGNOSIS — E559 Vitamin D deficiency, unspecified: Secondary | ICD-10-CM | POA: Diagnosis not present

## 2017-09-12 DIAGNOSIS — E8881 Metabolic syndrome: Secondary | ICD-10-CM

## 2017-09-12 DIAGNOSIS — E66812 Obesity, class 2: Secondary | ICD-10-CM

## 2017-09-12 NOTE — Patient Instructions (Signed)
Guidelines for a Low Cholesterol, Low Saturated Fat Diet   Fats - Limit total intake of fats and oils. - Avoid butter, stick margarine, shortening, lard, palm and coconut oils. - Limit mayonnaise, salad dressings, gravies and sauces, unless they are homemade with low-fat ingredients. - Limit chocolate. - Choose low-fat and nonfat products, such as low-fat mayonnaise, low-fat or non-hydrogenated peanut butter, low-fat or fat-free salad dressings and nonfat gravy. - Use vegetable oil, such as canola or olive oil. - Look for margarine that does not contain trans fatty acids. - Use nuts in moderate amounts. - Read ingredient labels carefully to determine both amount and type of fat present in foods. Limit saturated and trans fats! - Avoid high-fat processed and convenience foods.  Meats and Meat Alternatives - Choose fish, chicken, [place] and lean meats. - Use dried beans, peas, lentils and tofu. - Limit egg yolks to three to four per week. - If you eat red meat, limit to no more than three servings per week and choose loin or round cuts. - Avoid fatty meats, such as bacon, sausage, franks, luncheon meats and ribs. - Avoid all organ meats, including liver.  Dairy - Choose nonfat or low-fat milk, yogurt and cottage cheese. - Most cheeses are high in fat. Choose cheeses made from non-fat milk, such as mozzarella and ricotta cheese. - Choose light or fat-free cream cheese and sour cream. - Avoid cream and sauces made with cream.  Fruits and Vegetables - Eat a wide variety of fruits and vegetables. - Use lemon juice, vinegar or "mist" olive oil on vegetables. - Avoid adding sauces, fat or oil to vegetables.  Breads, Cereals and Grains - Choose whole-grain breads, cereals, pastas and rice. - Avoid high-fat snack foods, such as granola, cookies, pies, pastries, doughnuts and croissants.  Cooking Tips - Avoid deep fried foods. - Trim visible fat off meats and remove skin from poultry  before cooking. - Bake, broil, boil, poach or roast poultry, fish and lean meats. - Drain and discard fat that drains out of meat as you cook it. - Add little or no fat to foods. - Use vegetable oil sprays to grease pans for cooking or baking. - Steam vegetables. - Use herbs or no-oil marinades to flavor foods.    Risk factors for prediabetes and type 2 diabetes  Researchers don't fully understand why some people develop prediabetes and type 2 diabetes and others don't.  It's clear that certain factors increase the risk, however, including:  Weight. The more fatty tissue you have, the more resistant your cells become to insulin.  Inactivity. The less active you are, the greater your risk. Physical activity helps you control your weight, uses up glucose as energy and makes your cells more sensitive to insulin.  Family history. Your risk increases if a parent or sibling has type 2 diabetes.  Race. Although it's unclear why, people of certain races - including blacks, Hispanics, American Indians and Asian-Americans - are at higher risk.  Age. Your risk increases as you get older. This may be because you tend to exercise less, lose muscle mass and gain weight as you age. But type 2 diabetes is also increasing dramatically among children, adolescents and younger adults.  Gestational diabetes. If you developed gestational diabetes when you were pregnant, your risk of developing prediabetes and type 2 diabetes later increases. If you gave birth to a baby weighing more than 9 pounds (4 kilograms), you're also at risk of type 2 diabetes.  Polycystic  ovary syndrome. For women, having polycystic ovary syndrome - a common condition characterized by irregular menstrual periods, excess hair growth and obesity - increases the risk of diabetes.  High blood pressure. Having blood pressure over 140/90 millimeters of mercury (mm Hg) is linked to an increased risk of type 2 diabetes.  Abnormal cholesterol and  triglyceride levels. If you have low levels of high-density lipoprotein (HDL), or "good," cholesterol, your risk of type 2 diabetes is higher. Triglycerides are another type of fat carried in the blood. People with high levels of triglycerides have an increased risk of type 2 diabetes. Your doctor can let you know what your cholesterol and triglyceride levels are.  A good guide to good carbs: The glycemic index ---If you have diabetes, or at risk for diabetes, you know all too well that when you eat carbohydrates, your blood sugar goes up. The total amount of carbs you consume at a meal or in a snack mostly determines what your blood sugar will do. But the food itself also plays a role. A serving of white rice has almost the same effect as eating pure table sugar - a quick, high spike in blood sugar. A serving of lentils has a slower, smaller effect.  ---Picking good sources of carbs can help you control your blood sugar and your weight. Even if you don't have diabetes, eating healthier carbohydrate-rich foods can help ward off a host of chronic conditions, from heart disease to various cancers to, well, diabetes.  ---One way to choose foods is with the glycemic index (GI). This tool measures how much a food boosts blood sugar.  The glycemic index rates the effect of a specific amount of a food on blood sugar compared with the same amount of pure glucose. A food with a glycemic index of 28 boosts blood sugar only 28% as much as pure glucose. One with a GI of 95 acts like pure glucose.    High glycemic foods result in a quick spike in insulin and blood sugar (also known as blood glucose).  Low glycemic foods have a slower, smaller effect- these are healthier for you.   Using the glycemic index Using the glycemic index is easy: choose foods in the low GI category instead of those in the high GI category (see below), and go easy on those in between. Low glycemic index (GI of 55 or less): Most fruits and  vegetables, beans, minimally processed grains, pasta, low-fat dairy foods, and nuts.  Moderate glycemic index (GI 56 to 69): White and sweet potatoes, corn, white rice, couscous, breakfast cereals such as Cream of Wheat and Mini Wheats.  High glycemic index (GI of 70 or higher): White bread, rice cakes, most crackers, bagels, cakes, doughnuts, croissants, most packaged breakfast cereals. You can see the values for 100 commons foods and get links to more at www.[email or web address].si.  Swaps for lowering glycemic index  Instead of this high-glycemic index food Eat this lower-glycemic index food  White rice Brown rice or converted rice  Instant oatmeal Steel-cut oats  Cornflakes Bran flakes  Baked potato Pasta, bulgur  White bread Whole-grain bread  Corn Peas or leafy greens       Prediabetes Eating Plan  Prediabetes--also called impaired glucose tolerance or impaired fasting glucose--is a condition that causes blood sugar (blood glucose) levels to be higher than normal. Following a healthy diet can help to keep prediabetes under control. It can also help to lower the risk of type 2 diabetes and heart disease,  which are increased in people who have prediabetes. Along with regular exercise, a healthy diet:  Promotes weight loss.  Helps to control blood sugar levels.  Helps to improve the way that the body uses insulin.   WHAT DO I NEED TO KNOW ABOUT THIS EATING PLAN?   Use the glycemic index (GI) to plan your meals. The index tells you how quickly a food will raise your blood sugar. Choose low-GI foods. These foods take a longer time to raise blood sugar.  Pay close attention to the amount of carbohydrates in the food that you eat. Carbohydrates increase blood sugar levels.  Keep track of how many calories you take in. Eating the right amount of calories will help you to achieve a healthy weight. Losing about 7 percent of your starting weight can help to prevent type 2  diabetes.  You may want to follow a Mediterranean diet. This diet includes a lot of vegetables, lean meats or fish, whole grains, fruits, and healthy oils and fats.   WHAT FOODS CAN I EAT?  Grains Whole grains, such as whole-wheat or whole-grain breads, crackers, cereals, and pasta. Unsweetened oatmeal. Bulgur. Barley. Quinoa. Brown rice. Corn or whole-wheat flour tortillas or taco shells. Vegetables Lettuce. Spinach. Peas. Beets. Cauliflower. Cabbage. Broccoli. Carrots. Tomatoes. Squash. Eggplant. Herbs. Peppers. Onions. Cucumbers. Brussels sprouts. Fruits Berries. Bananas. Apples. Oranges. Grapes. Papaya. Mango. Pomegranate. Kiwi. Grapefruit. Cherries. Meats and Other Protein Sources Seafood. Lean meats, such as chicken and [place] or lean cuts of pork and beef. Tofu. Eggs. Nuts. Beans. Dairy Low-fat or fat-free dairy products, such as yogurt, cottage cheese, and cheese. Beverages Water. Tea. Coffee. Sugar-free or diet soda. Seltzer water. Milk. Milk alternatives, such as soy or almond milk. Condiments Mustard. Relish. Low-fat, low-sugar ketchup. Low-fat, low-sugar barbecue sauce. Low-fat or fat-free mayonnaise. Sweets and Desserts Sugar-free or low-fat pudding. Sugar-free or low-fat ice cream and other frozen treats. Fats and Oils Avocado. Walnuts. Olive oil. The items listed above may not be a complete list of recommended foods or beverages. Contact your dietitian for more options.    WHAT FOODS ARE NOT RECOMMENDED?  Grains Refined white flour and flour products, such as bread, pasta, snack foods, and cereals. Beverages Sweetened drinks, such as sweet iced tea and soda. Sweets and Desserts Baked goods, such as cake, cupcakes, pastries, cookies, and cheesecake. The items listed above may not be a complete list of foods and beverages to avoid. Contact your dietitian for more information.   This information is not intended to replace advice given to you by your health care  provider. Make sure you discuss any questions you have with your health care provider.   Document Released: 06/28/2014 Document Reviewed: 06/28/2014 Elsevier Interactive Patient Education 2016 [place] for Adults, Female  A healthy lifestyle and preventive care can promote health and wellness. Preventive health guidelines for women include the following key practices.   A routine yearly physical is a good way to check with your health care provider about your health and preventive screening. It is a chance to share any concerns and updates on your health and to receive a thorough exam.   Visit your dentist for a routine exam and preventive care every 6 months. Brush your teeth twice a day and floss once a day. Good oral hygiene prevents tooth decay and gum disease.   The frequency of eye exams is based on your age, health, family medical history, use of contact lenses,  and other factors. Follow your health care provider's recommendations for frequency of eye exams.   Eat a healthy diet. Foods like vegetables, fruits, whole grains, low-fat dairy products, and lean protein foods contain the nutrients you need without too many calories. Decrease your intake of foods high in solid fats, added sugars, and salt. Eat the right amount of calories for you.Get information about a proper diet from your health care provider, if necessary.   Regular physical exercise is one of the most important things you can do for your health. Most adults should get at least 150 minutes of moderate-intensity exercise (any activity that increases your heart rate and causes you to sweat) each week. In addition, most adults need muscle-strengthening exercises on 2 or more days a week.   Maintain a healthy weight. The body mass index (BMI) is a screening tool to identify possible weight problems. It provides an estimate of body fat based on height and weight. Your health care provider can  find your BMI, and can help you achieve or maintain a healthy weight.For adults 20 years and older:   - A BMI below 18.5 is considered underweight.   - A BMI of 18.5 to 24.9 is normal.   - A BMI of 25 to 29.9 is considered overweight.   - A BMI of 30 and above is considered obese.   Maintain normal blood lipids and cholesterol levels by exercising and minimizing your intake of trans and saturated fats.  Eat a balanced diet with plenty of fruit and vegetables. Blood tests for lipids and cholesterol should begin at age 52 and be repeated every 5 years minimum.  If your lipid or cholesterol levels are high, you are over 40, or you are at high risk for heart disease, you may need your cholesterol levels checked more frequently.Ongoing high lipid and cholesterol levels should be treated with medicines if diet and exercise are not working.   If you smoke, find out from your health care provider how to quit. If you do not use tobacco, do not start.   Lung cancer screening is recommended for adults aged 45-80 years who are at high risk for developing lung cancer because of a history of smoking. A yearly low-dose CT scan of the lungs is recommended for people who have at least a 30-pack-year history of smoking and are a current smoker or have quit within the past 15 years. A pack year of smoking is smoking an average of 1 pack of cigarettes a day for 1 year (for example: 1 pack a day for 30 years or 2 packs a day for 15 years). Yearly screening should continue until the smoker has stopped smoking for at least 15 years. Yearly screening should be stopped for people who develop a health problem that would prevent them from having lung cancer treatment.   If you are pregnant, do not drink alcohol. If you are breastfeeding, be very cautious about drinking alcohol. If you are not pregnant and choose to drink alcohol, do not have more than 1 drink per day. One drink is considered to be 12 ounces (355 mL) of beer,  5 ounces (148 mL) of wine, or 1.5 ounces (44 mL) of liquor.   Avoid use of street drugs. Do not share needles with anyone. Ask for help if you need support or instructions about stopping the use of drugs.   High blood pressure causes heart disease and increases the risk of stroke. Your blood pressure should be checked  at least yearly.  Ongoing high blood pressure should be treated with medicines if weight loss and exercise do not work.   If you are 1-58 years old, ask your health care provider if you should take aspirin to prevent strokes.   Diabetes screening involves taking a blood sample to check your fasting blood sugar level. This should be done once every 3 years, after age 72, if you are within normal weight and without risk factors for diabetes. Testing should be considered at a younger age or be carried out more frequently if you are overweight and have at least 1 risk factor for diabetes.   Breast cancer screening is essential preventive care for women. You should practice "breast self-awareness."  This means understanding the normal appearance and feel of your breasts and may include breast self-examination.  Any changes detected, no matter how small, should be reported to a health care provider.  Women in their 4s and 30s should have a clinical breast exam (CBE) by a health care provider as part of a regular health exam every 1 to 3 years.  After age 17, women should have a CBE every year.  Starting at age 35, women should consider having a mammogram (breast X-ray test) every year.  Women who have a family history of breast cancer should talk to their health care provider about genetic screening.  Women at a high risk of breast cancer should talk to their health care providers about having an MRI and a mammogram every year.   -Breast cancer gene (BRCA)-related cancer risk assessment is recommended for women who have family members with BRCA-related cancers. BRCA-related cancers include  breast, ovarian, tubal, and peritoneal cancers. Having family members with these cancers may be associated with an increased risk for harmful changes (mutations) in the breast cancer genes BRCA1 and BRCA2. Results of the assessment will determine the need for genetic counseling and BRCA1 and BRCA2 testing.   The Pap test is a screening test for cervical cancer. A Pap test can show cell changes on the cervix that might become cervical cancer if left untreated. A Pap test is a procedure in which cells are obtained and examined from the lower end of the uterus (cervix).   - Women should have a Pap test starting at age 71.   - Between ages 22 and 2, Pap tests should be repeated every 2 years.   - Beginning at age 89, you should have a Pap test every 3 years as long as the past 3 Pap tests have been normal.   - Some women have medical problems that increase the chance of getting cervical cancer. Talk to your health care provider about these problems. It is especially important to talk to your health care provider if a new problem develops soon after your last Pap test. In these cases, your health care provider may recommend more frequent screening and Pap tests.   - The above recommendations are the same for women who have or have not gotten the vaccine for human papillomavirus (HPV).   - If you had a hysterectomy for a problem that was not cancer or a condition that could lead to cancer, then you no longer need Pap tests. Even if you no longer need a Pap test, a regular exam is a good idea to make sure no other problems are starting.   - If you are between ages 65 and 23 years, and you have had normal Pap tests going back 10 years,  you no longer need Pap tests. Even if you no longer need a Pap test, a regular exam is a good idea to make sure no other problems are starting.   - If you have had past treatment for cervical cancer or a condition that could lead to cancer, you need Pap tests and screening  for cancer for at least 20 years after your treatment.   - If Pap tests have been discontinued, risk factors (such as a new sexual partner) need to be reassessed to determine if screening should be resumed.   - The HPV test is an additional test that may be used for cervical cancer screening. The HPV test looks for the virus that can cause the cell changes on the cervix. The cells collected during the Pap test can be tested for HPV. The HPV test could be used to screen women aged 67 years and older, and should be used in women of any age who have unclear Pap test results. After the age of 74, women should have HPV testing at the same frequency as a Pap test.   Colorectal cancer can be detected and often prevented. Most routine colorectal cancer screening begins at the age of 42 years and continues through age 70 years. However, your health care provider may recommend screening at an earlier age if you have risk factors for colon cancer. On a yearly basis, your health care provider may provide home test kits to check for hidden blood in the stool.  Use of a small camera at the end of a tube, to directly examine the colon (sigmoidoscopy or colonoscopy), can detect the earliest forms of colorectal cancer. Talk to your health care provider about this at age 75, when routine screening begins. Direct exam of the colon should be repeated every 5 -10 years through age 76 years, unless early forms of pre-cancerous polyps or small growths are found.   People who are at an increased risk for hepatitis B should be screened for this virus. You are considered at high risk for hepatitis B if:  -You were born in a country where hepatitis B occurs often. Talk with your health care provider about which countries are considered high risk.  - Your parents were born in a high-risk country and you have not received a shot to protect against hepatitis B (hepatitis B vaccine).  - You have HIV or AIDS.  - You use needles to  inject street drugs.  - You live with, or have sex with, someone who has Hepatitis B.  - You get hemodialysis treatment.  - You take certain medicines for conditions like cancer, organ transplantation, and autoimmune conditions.   Hepatitis C blood testing is recommended for all people born from [birth year] through 1966 and any individual with known risks for hepatitis C.   Practice safe sex. Use condoms and avoid high-risk sexual practices to reduce the spread of sexually transmitted infections (STIs). STIs include gonorrhea, chlamydia, syphilis, trichomonas, herpes, HPV, and human immunodeficiency virus (HIV). Herpes, HIV, and HPV are viral illnesses that have no cure. They can result in disability, cancer, and death. Sexually active women aged 102 years and younger should be checked for chlamydia. Older women with new or multiple partners should also be tested for chlamydia. Testing for other STIs is recommended if you are sexually active and at increased risk.   Osteoporosis is a disease in which the bones lose minerals and strength with aging. This can result in serious bone fractures or  breaks. The risk of osteoporosis can be identified using a bone density scan. Women ages 73 years and over and women at risk for fractures or osteoporosis should discuss screening with their health care providers. Ask your health care provider whether you should take a calcium supplement or vitamin D to There are also several preventive steps women can take to avoid osteoporosis and resulting fractures or to keep osteoporosis from worsening. -->Recommendations include:  Eat a balanced diet high in fruits, vegetables, calcium, and vitamins.  Get enough calcium. The recommended total intake of is 1,200 mg daily; for best absorption, if taking supplements, divide doses into 250-500 mg doses throughout the day. Of the two types of calcium, calcium carbonate is best absorbed when taken with food but calcium citrate can be  taken on an empty stomach.  Get enough vitamin D. [facility] and the [employer] recommend at least 1,000 IU per day for women age 31 and over who are at risk of vitamin D deficiency. Vitamin D deficiency can be caused by inadequate sun exposure (for example, those who live in [place]).  Avoid alcohol and smoking. Heavy alcohol intake (more than 7 drinks per week) increases the risk of falls and hip fracture and women smokers tend to lose bone more rapidly and have lower bone mass than nonsmokers. Stopping smoking is one of the most important changes women can make to improve their health and decrease risk for disease.  Be physically active every day. Weight-bearing exercise (for example, fast walking, hiking, jogging, and weight training) may strengthen bones or slow the rate of bone loss that comes with aging. Balancing and muscle-strengthening exercises can reduce the risk of falling and fracture.  Consider therapeutic medications. Currently, several types of effective drugs are available. Healthcare providers can recommend the type most appropriate for each woman.  Eliminate environmental factors that may contribute to accidents. Falls cause nearly 90% of all osteoporotic fractures, so reducing this risk is an important bone-health strategy. Measures include ample lighting, removing obstructions to walking, using nonskid rugs on floors, and placing mats and/or grab bars in showers.  Be aware of medication side effects. Some common medicines make bones weaker. These include a type of steroid drug called glucocorticoids used for arthritis and asthma, some antiseizure drugs, certain sleeping pills, treatments for endometriosis, and some cancer drugs. An overactive thyroid gland or using too much thyroid hormone for an underactive thyroid can also be a problem. If you are taking these medicines, talk to your doctor about what you can do to help protect your bones.reduce the rate  of osteoporosis.    Menopause can be associated with physical symptoms and risks. Hormone replacement therapy is available to decrease symptoms and risks. You should talk to your health care provider about whether hormone replacement therapy is right for you.   Use sunscreen. Apply sunscreen liberally and repeatedly throughout the day. You should seek shade when your shadow is shorter than you. Protect yourself by wearing long sleeves, pants, a wide-brimmed hat, and sunglasses year round, whenever you are outdoors.   Once a month, do a whole body skin exam, using a mirror to look at the skin on your back. Tell your health care provider of new moles, moles that have irregular borders, moles that are larger than a pencil eraser, or moles that have changed in shape or color.   -Stay current with required vaccines (immunizations).   Influenza vaccine. All adults should be immunized every year.  Tetanus, diphtheria,  and acellular pertussis (Td, Tdap) vaccine. Pregnant women should receive 1 dose of Tdap vaccine during each pregnancy. The dose should be obtained regardless of the length of time since the last dose. Immunization is preferred during the 27th 36th week of gestation. An adult who has not previously received Tdap or who does not know her vaccine status should receive 1 dose of Tdap. This initial dose should be followed by tetanus and diphtheria toxoids (Td) booster doses every 10 years. Adults with an unknown or incomplete history of completing a 3-dose immunization series with Td-containing vaccines should begin or complete a primary immunization series including a Tdap dose. Adults should receive a Td booster every 10 years.  Varicella vaccine. An adult without evidence of immunity to varicella should receive 2 doses or a second dose if she has previously received 1 dose. Pregnant females who do not have evidence of immunity should receive the first dose after pregnancy. This first dose  should be obtained before leaving the health care facility. The second dose should be obtained 4 8 weeks after the first dose.  Human papillomavirus (HPV) vaccine. Females aged 26 26 years who have not received the vaccine previously should obtain the 3-dose series. The vaccine is not recommended for use in pregnant females. However, pregnancy testing is not needed before receiving a dose. If a female is found to be pregnant after receiving a dose, no treatment is needed. In that case, the remaining doses should be delayed until after the pregnancy. Immunization is recommended for any person with an immunocompromised condition through the age of 77 years if she did not get any or all doses earlier. During the 3-dose series, the second dose should be obtained 4 8 weeks after the first dose. The third dose should be obtained 24 weeks after the first dose and 16 weeks after the second dose.  Zoster vaccine. One dose is recommended for adults aged 2 years or older unless certain conditions are present.  Measles, mumps, and rubella (MMR) vaccine. Adults born before [birth year] generally are considered immune to measles and mumps. Adults born in [birth year] or later should have 1 or more doses of MMR vaccine unless there is a contraindication to the vaccine or there is laboratory evidence of immunity to each of the three diseases. A routine second dose of MMR vaccine should be obtained at least 28 days after the first dose for students attending postsecondary schools, health care workers, or international travelers. People who received inactivated measles vaccine or an unknown type of measles vaccine during 1963 1967 should receive 2 doses of MMR vaccine. People who received inactivated mumps vaccine or an unknown type of mumps vaccine before 1979 and are at high risk for mumps infection should consider immunization with 2 doses of MMR vaccine. For females of childbearing age, rubella immunity should be determined. If there is  no evidence of immunity, females who are not pregnant should be vaccinated. If there is no evidence of immunity, females who are pregnant should delay immunization until after pregnancy. Unvaccinated health care workers born before [birth year] who lack laboratory evidence of measles, mumps, or rubella immunity or laboratory confirmation of disease should consider measles and mumps immunization with 2 doses of MMR vaccine or rubella immunization with 1 dose of MMR vaccine.  Pneumococcal 13-valent conjugate (PCV13) vaccine. When indicated, a person who is uncertain of her immunization history and has no record of immunization should receive the PCV13 vaccine. An adult aged 62 years or older who  has certain medical conditions and has not been previously immunized should receive 1 dose of PCV13 vaccine. This PCV13 should be followed with a dose of pneumococcal polysaccharide (PPSV23) vaccine. The PPSV23 vaccine dose should be obtained at least 8 weeks after the dose of PCV13 vaccine. An adult aged 19 years or older who has certain medical conditions and previously received 1 or more doses of PPSV23 vaccine should receive 1 dose of PCV13. The PCV13 vaccine dose should be obtained 1 or more years after the last PPSV23 vaccine dose.  Pneumococcal polysaccharide (PPSV23) vaccine. When PCV13 is also indicated, PCV13 should be obtained first. All adults aged 75 years and older should be immunized. An adult younger than age 69 years who has certain medical conditions should be immunized. Any person who resides in a nursing home or long-term care facility should be immunized. An adult smoker should be immunized. People with an immunocompromised condition and certain other conditions should receive both PCV13 and PPSV23 vaccines. People with human immunodeficiency virus (HIV) infection should be immunized as soon as possible after diagnosis. Immunization during chemotherapy or radiation therapy should be avoided. Routine use of  PPSV23 vaccine is not recommended for American Indians, [place] Natives, or people younger than 65 years unless there are medical conditions that require PPSV23 vaccine. When indicated, people who have unknown immunization and have no record of immunization should receive PPSV23 vaccine. One-time revaccination 5 years after the first dose of PPSV23 is recommended for people aged 38 64 years who have chronic kidney failure, nephrotic syndrome, asplenia, or immunocompromised conditions. People who received 1 2 doses of PPSV23 before age 31 years should receive another dose of PPSV23 vaccine at age 41 years or later if at least 5 years have passed since the previous dose. Doses of PPSV23 are not needed for people immunized with PPSV23 at or after age 51 years.  Meningococcal vaccine. Adults with asplenia or persistent complement component deficiencies should receive 2 doses of quadrivalent meningococcal conjugate (MenACWY-D) vaccine. The doses should be obtained at least 2 months apart. Microbiologists working with certain meningococcal bacteria, [employer] recruits, people at risk during an outbreak, and people who travel to or live in countries with a high rate of meningitis should be immunized. A first-year college student up through age 29 years who is living in a residence hall should receive a dose if she did not receive a dose on or after her 16th birthday. Adults who have certain high-risk conditions should receive one or more doses of vaccine.  Hepatitis A vaccine. Adults who wish to be protected from this disease, have certain high-risk conditions, work with hepatitis A-infected animals, work in hepatitis A research labs, or travel to or work in countries with a high rate of hepatitis A should be immunized. Adults who were previously unvaccinated and who anticipate close contact with an international adoptee during the first 60 days after arrival in the [place] from a country with a high rate of  hepatitis A should be immunized.  Hepatitis B vaccine.  Adults who wish to be protected from this disease, have certain high-risk conditions, may be exposed to blood or other infectious body fluids, are household contacts or sex partners of hepatitis B positive people, are clients or workers in certain care facilities, or travel to or work in countries with a high rate of hepatitis B should be immunized.  Haemophilus influenzae type b (Hib) vaccine. A previously unvaccinated person with asplenia or sickle cell disease or having a  scheduled splenectomy should receive 1 dose of Hib vaccine. Regardless of previous immunization, a recipient of a hematopoietic stem cell transplant should receive a 3-dose series 6 12 months after her successful transplant. Hib vaccine is not recommended for adults with HIV infection.  Preventive Services / Frequency Ages 42 to 39years  Blood pressure check.** / Every 1 to 2 years.  Lipid and cholesterol check.** / Every 5 years beginning at age 70.  Clinical breast exam.** / Every 3 years for women in their 28s and 50s.  BRCA-related cancer risk assessment.** / For women who have family members with a BRCA-related cancer (breast, ovarian, tubal, or peritoneal cancers).  Pap test.** / Every 2 years from ages 73 through 62. Every 3 years starting at age 77 through age 26 or 22 with a history of 3 consecutive normal Pap tests.  HPV screening.** / Every 3 years from ages 102 through ages 29 to 67 with a history of 3 consecutive normal Pap tests.  Hepatitis C blood test.** / For any individual with known risks for hepatitis C.  Skin self-exam. / Monthly.  Influenza vaccine. / Every year.  Tetanus, diphtheria, and acellular pertussis (Tdap, Td) vaccine.** / Consult your health care provider. Pregnant women should receive 1 dose of Tdap vaccine during each pregnancy. 1 dose of Td every 10 years.  Varicella vaccine.** / Consult your health care provider. Pregnant  females who do not have evidence of immunity should receive the first dose after pregnancy.  HPV vaccine. / 3 doses over 6 months, if 13 and younger. The vaccine is not recommended for use in pregnant females. However, pregnancy testing is not needed before receiving a dose.  Measles, mumps, rubella (MMR) vaccine.** / You need at least 1 dose of MMR if you were born in 1957 or later. You may also need a 2nd dose. For females of childbearing age, rubella immunity should be determined. If there is no evidence of immunity, females who are not pregnant should be vaccinated. If there is no evidence of immunity, females who are pregnant should delay immunization until after pregnancy.  Pneumococcal 13-valent conjugate (PCV13) vaccine.** / Consult your health care provider.  Pneumococcal polysaccharide (PPSV23) vaccine.** / 1 to 2 doses if you smoke cigarettes or if you have certain conditions.  Meningococcal vaccine.** / 1 dose if you are age 58 to 76 years and a Market researcher living in a residence hall, or have one of several medical conditions, you need to get vaccinated against meningococcal disease. You may also need additional booster doses.  Hepatitis A vaccine.** / Consult your health care provider.  Hepatitis B vaccine.** / Consult your health care provider.  Haemophilus influenzae type b (Hib) vaccine.** / Consult your health care provider.  Ages 60 to 64years  Blood pressure check.** / Every 1 to 2 years.  Lipid and cholesterol check.** / Every 5 years beginning at age 58 years.  Lung cancer screening. / Every year if you are aged 8 80 years and have a 30-pack-year history of smoking and currently smoke or have quit within the past 15 years. Yearly screening is stopped once you have quit smoking for at least 15 years or develop a health problem that would prevent you from having lung cancer treatment.  Clinical breast exam.** / Every year after age 42  years.  BRCA-related cancer risk assessment.** / For women who have family members with a BRCA-related cancer (breast, ovarian, tubal, or peritoneal cancers).  Mammogram.** / Every year beginning  at age 53 years and continuing for as long as you are in good health. Consult with your health care provider.  Pap test.** / Every 3 years starting at age 71 years through age 75 or 70 years with a history of 3 consecutive normal Pap tests.  HPV screening.** / Every 3 years from ages 27 years through ages 9 to 63 years with a history of 3 consecutive normal Pap tests.  Fecal occult blood test (FOBT) of stool. / Every year beginning at age 75 years and continuing until age 17 years. You may not need to do this test if you get a colonoscopy every 10 years.  Flexible sigmoidoscopy or colonoscopy.** / Every 5 years for a flexible sigmoidoscopy or every 10 years for a colonoscopy beginning at age 109 years and continuing until age 34 years.  Hepatitis C blood test.** / For all people born from [birth year] through 1966 and any individual with known risks for hepatitis C.  Skin self-exam. / Monthly.  Influenza vaccine. / Every year.  Tetanus, diphtheria, and acellular pertussis (Tdap/Td) vaccine.** / Consult your health care provider. Pregnant women should receive 1 dose of Tdap vaccine during each pregnancy. 1 dose of Td every 10 years.  Varicella vaccine.** / Consult your health care provider. Pregnant females who do not have evidence of immunity should receive the first dose after pregnancy.  Zoster vaccine.** / 1 dose for adults aged 52 years or older.  Measles, mumps, rubella (MMR) vaccine.** / You need at least 1 dose of MMR if you were born in 1957 or later. You may also need a 2nd dose. For females of childbearing age, rubella immunity should be determined. If there is no evidence of immunity, females who are not pregnant should be vaccinated. If there is no evidence of immunity, females who are  pregnant should delay immunization until after pregnancy.  Pneumococcal 13-valent conjugate (PCV13) vaccine.** / Consult your health care provider.  Pneumococcal polysaccharide (PPSV23) vaccine.** / 1 to 2 doses if you smoke cigarettes or if you have certain conditions.  Meningococcal vaccine.** / Consult your health care provider.  Hepatitis A vaccine.** / Consult your health care provider.  Hepatitis B vaccine.** / Consult your health care provider.  Haemophilus influenzae type b (Hib) vaccine.** / Consult your health care provider.  Ages 41 years and over  Blood pressure check.** / Every 1 to 2 years.  Lipid and cholesterol check.** / Every 5 years beginning at age 68 years.  Lung cancer screening. / Every year if you are aged 71 80 years and have a 30-pack-year history of smoking and currently smoke or have quit within the past 15 years. Yearly screening is stopped once you have quit smoking for at least 15 years or develop a health problem that would prevent you from having lung cancer treatment.  Clinical breast exam.** / Every year after age 20 years.  BRCA-related cancer risk assessment.** / For women who have family members with a BRCA-related cancer (breast, ovarian, tubal, or peritoneal cancers).  Mammogram.** / Every year beginning at age 32 years and continuing for as long as you are in good health. Consult with your health care provider.  Pap test.** / Every 3 years starting at age 86 years through age 32 or 42 years with 3 consecutive normal Pap tests. Testing can be stopped between 65 and 70 years with 3 consecutive normal Pap tests and no abnormal Pap or HPV tests in the past 10 years.  HPV screening.** /  Every 3 years from ages 37 years through ages 42 or 89 years with a history of 3 consecutive normal Pap tests. Testing can be stopped between 65 and 70 years with 3 consecutive normal Pap tests and no abnormal Pap or HPV tests in the past 10 years.  Fecal occult  blood test (FOBT) of stool. / Every year beginning at age 19 years and continuing until age 61 years. You may not need to do this test if you get a colonoscopy every 10 years.  Flexible sigmoidoscopy or colonoscopy.** / Every 5 years for a flexible sigmoidoscopy or every 10 years for a colonoscopy beginning at age 3 years and continuing until age 56 years.  Hepatitis C blood test.** / For all people born from [birth year] through 1965 and any individual with known risks for hepatitis C.  Osteoporosis screening.** / A one-time screening for women ages 60 years and over and women at risk for fractures or osteoporosis.  Skin self-exam. / Monthly.  Influenza vaccine. / Every year.  Tetanus, diphtheria, and acellular pertussis (Tdap/Td) vaccine.** / 1 dose of Td every 10 years.  Varicella vaccine.** / Consult your health care provider.  Zoster vaccine.** / 1 dose for adults aged 103 years or older.  Pneumococcal 13-valent conjugate (PCV13) vaccine.** / Consult your health care provider.  Pneumococcal polysaccharide (PPSV23) vaccine.** / 1 dose for all adults aged 25 years and older.  Meningococcal vaccine.** / Consult your health care provider.  Hepatitis A vaccine.** / Consult your health care provider.  Hepatitis B vaccine.** / Consult your health care provider.  Haemophilus influenzae type b (Hib) vaccine.** / Consult your health care provider. ** Family history and personal history of risk and conditions may change your health care provider's recommendations. Document Released: 04/09/2001 Document Revised: 12/02/2012  [facility] Patient Information 2014 [place], [place].   EXERCISE AND DIET:  We recommended that you start or continue a regular exercise program for good health. Regular exercise means any activity that makes your heart beat faster and makes you sweat.  We recommend exercising at least 30 minutes per day at least 3 days a week, preferably 5.  We also recommend a diet low in fat  and sugar / carbohydrates.  Inactivity, poor dietary choices and obesity can cause diabetes, heart attack, stroke, and kidney damage, among others.     ALCOHOL AND SMOKING:  Women should limit their alcohol intake to no more than 7 drinks/beers/glasses of wine (combined, not each!) per week. Moderation of alcohol intake to this level decreases your risk of breast cancer and liver damage.  ( And of course, no recreational drugs are part of a healthy lifestyle.)  Also, you should not be smoking at all or even being exposed to second hand smoke. Most people know smoking can cause cancer, and various heart and lung diseases, but did you know it also contributes to weakening of your bones?  Aging of your skin?  Yellowing of your teeth and nails?   CALCIUM AND VITAMIN D:  Adequate intake of calcium and Vitamin D are recommended.  The recommendations for exact amounts of these supplements seem to change often, but generally speaking 600 mg of calcium (either carbonate or citrate) and 800 units of Vitamin D per day seems prudent. Certain women may benefit from higher intake of Vitamin D.  If you are among these women, your doctor will have told you during your visit.     PAP SMEARS:  Pap smears, to check for cervical cancer or  precancers,  have traditionally been done yearly, although recent scientific advances have shown that most women can have pap smears less often.  However, every woman still should have a physical exam from her gynecologist or primary care physician every year. It will include a breast check, inspection of the vulva and vagina to check for abnormal growths or skin changes, a visual exam of the cervix, and then an exam to evaluate the size and shape of the uterus and ovaries.  And after 33 years of age, a rectal exam is indicated to check for rectal cancers. We will also provide age appropriate advice regarding health maintenance, like when you should have certain vaccines, screening for  sexually transmitted diseases, bone density testing, colonoscopy, mammograms, etc.    MAMMOGRAMS:  All women over 52 years old should have a yearly mammogram. Many facilities now offer a "3D" mammogram, which may cost around $50 extra out of pocket. If possible,  we recommend you accept the option to have the 3D mammogram performed.  It both reduces the number of women who will be called back for extra views which then turn out to be normal, and it is better than the routine mammogram at detecting truly abnormal areas.     COLONOSCOPY:  Colonoscopy to screen for colon cancer is recommended for all women at age 78.  We know, you hate the idea of the prep.  We agree, BUT, having colon cancer and not knowing it is worse!!  Colon cancer so often starts as a polyp that can be seen and removed at colonscopy, which can quite literally save your life!  And if your first colonoscopy is normal and you have no family history of colon cancer, most women don't have to have it again for 10 years.  Once every ten years, you can do something that may end up saving your life, right?  We will be happy to help you get it scheduled when you are ready.  Be sure to check your insurance coverage so you understand how much it will cost.  It may be covered as a preventative service at no cost, but you should check your particular policy.

## 2017-09-12 NOTE — Telephone Encounter (Signed)
Patient needs to reschedule ultrasound appointment.  Cc: Thomasene LotSuzy

## 2017-09-12 NOTE — Telephone Encounter (Signed)
Spoke with patient. PUS rescheduled to 09/30/17 at 33:30pm with consult to follow at 4pm with Dr. Oscar LaJertson.   Routing to provider for final review. Patient is agreeable to disposition. Will close encounter.  Cc: Harland DingwallSuzy Dixon, [place]

## 2017-09-12 NOTE — Progress Notes (Signed)
Impression and Recommendations:    1. Encounter for wellness examination   2. likely Metabolic syndrome   3. Vitamin D deficiency   4. Healthcare maintenance   5. Class 2 obesity with body mass index (BMI) of 35 to 39.9 without comorbidity     1) Anticipatory Guidance: Discussed importance of wearing a seatbelt while driving, not texting while driving; sunscreen when outside along with yearly skin surveillance; eating a well balanced and modest diet; physical activity at least 25 minutes per day or 150 min/ week of moderate to intense activity.  - Counseled patient on monitoring skin, looking for unusual growth, darkness, changes, irregular borders to moles or spots, etc.  - Encouraged patient to continue regular follow-up with specialists, including OBGYN.  2) Immunizations / Screenings / Labs: All immunizations and screenings that patient agrees to, are up-to-date per recommendations or will be updated today.  Patient understands the needs for q 31mo dental and yearly vision screens which pt will schedule independently. Obtain CBC, CMP, HgA1c, Lipid panel, TSH and vit D when fasting if not already done recently.   3) Weight: Discussed goal of losing even 5-10% of current body weight which would improve overall feelings of well being and improve objective health data significantly.   Improve nutrient density of diet through increasing intake of fruits and vegetables and decreasing saturated/trans fats, white flour products and refined sugar products.   4) BMI Counseling: Explained to patient what BMI refers to, and what it means medically.    Told patient to think about it as a "medical risk stratification measurement" and how increasing BMI is associated with increasing risk/ or worsening state of various diseases such as hypertension, hyperlipidemia, diabetes, premature OA, depression etc.  American Heart Association guidelines for healthy diet, basically Mediterranean diet, and  exercise guidelines of 30 minutes 5 days per week or more discussed in detail.  Health counseling performed.  All questions answered.  5) Lifestyle & Preventative Health Maintenance: - Advised patient to continue working toward exercising to improve overall mental, physical, and emotional health.    - Recommended that the patient install the LoseIt app, MyFitnessPal app, look up Brett CanalesSteve [employer] online, or [employer] to start a prudent "lean and clean" eating plan.  - Encouraged patient to engage in daily physical activity, to a goal of 30 minutes 5 days per week.  Recommended that the patient eventually strive for at least 150 minutes of moderate cardiovascular activity per week according to guidelines established by the [facility].   - Healthy dietary habits encouraged, including low-carb, and high amounts of lean protein in diet.   - Patient should also consume adequate amounts of water - half of body weight in oz of water per day.  6) Follow-Up: - Full fasting lab work drawn today for evaluation.  - Return as scheduled for regular chronic follow-up and health maintenance, for Mood Management and ADD.   No orders of the defined types were placed in this encounter.   Orders Placed This Encounter  Procedures  . Insulin, Free and Total    Gross side effects, risk and benefits, and alternatives of medications discussed with patient.  Patient is aware that all medications have potential side effects and we are unable to predict every side effect or drug-drug interaction that may occur.  Expresses verbal understanding and consents to current therapy plan and treatment regimen.  F-up preventative CPE in 1 year. F/up sooner for chronic care management as discussed and/or  prn.  Please see orders placed and AVS handed out to patient at the end of our visit for further patient instructions/ counseling done pertaining to today's office visit.  This document serves as a record of services  personally performed by Thomasene Lot, DO. It was created on her behalf by Peggye Fothergill, a trained medical scribe. The creation of this record is based on the scribe's personal observations and the provider's statements to them.   I have reviewed the above medical documentation for accuracy and completeness and I concur.  Thomasene Lot 09/21/17 9:36 PM    Subjective:    Chief Complaint  Patient presents with  . Annual Exam   CC:   HPI: Joanna Campos is a 32 y.o. female who presents to [facility] today a yearly health maintenance exam.  Health Maintenance Summary Reviewed and updated, unless pt declines services.  Tobacco History Reviewed:   Y; never smoker. Alcohol:    No concerns, no excessive use Exercise Habits:   Not meeting AHA guidelines STD concerns:   none Drug Use:   None Birth control method:   n/a Menses regular:     n/a Lumps or breast concerns:      no Breast Cancer Family History:      No  Lifestyle Patient has been planning to start [facility] weight loss program.  She may begin Weight Watchers instead.  She plans on beginning a prudent diet & lifestyle program alongside her husband.  OBGYN Just had her annual exam and pap smear with Gertie Exon.  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  She denies the presence of cysts in her ovaries.  Skin Health Patient denies skin changes.  General Health Denies chest pain, SOB, dizziness.  Denies concerns about GI tract.  Denies issues with constipation or diarrhea.  Patient notes that her ADD medication causes her to go to the bathroom.   Immunization History  Administered Date(s) Administered  . Influenza,inj,Quad PF,6+ Mos 11/18/2013  . Tdap 01/31/2014    Health Maintenance  Topic Date Due  . HIV Screening  08/02/2018 (Originally [birth year]02/1999)  . INFLUENZA VACCINE  09/25/2017  . PAP SMEAR   09/10/2020  . TETANUS/TDAP  02/01/2024     Wt Readings from Last 3 Encounters:  09/12/17 208 lb 12.8 oz (94.7 kg)  09/10/17 207 lb 9.6 oz (94.2 kg)  08/01/17 205 lb (93 kg)   BP Readings from Last 3 Encounters:  09/12/17 107/73  09/10/17 120/81  08/01/17 121/80   Pulse Readings from Last 3 Encounters:  09/12/17 65  09/10/17 64  08/01/17 89     Past Medical History:  Diagnosis Date  . ADD (attention deficit disorder)   . Anemia   . Bicornate uterus   . Gestational diabetes       Past Surgical History:  Procedure Laterality Date  . BREAST SURGERY    . CHOLECYSTECTOMY        Family History  Problem Relation Age of Onset  . Heart disease Father   . Stroke Maternal Grandfather       Social History   Substance and Sexual Activity  Drug Use No  ,   Social History   Substance and Sexual Activity  Alcohol Use No  ,   Social History   Tobacco Use  Smoking Status Never Smoker  Smokeless Tobacco Never Used  ,  Social History   Substance and Sexual Activity  Sexual Activity Yes  . Birth control/protection: IUD   Comment: Mirena placed in 2016    Current Outpatient Medications on File Prior to Visit  Medication Sig Dispense Refill  . ADDERALL XR 30 MG 24 hr capsule Take 1 capsule (30 mg total) by mouth daily. 30 capsule 0  . amphetamine-dextroamphetamine (ADDERALL) 15 MG tablet One half tab daily at around 1 PM 15 tablet 0  . buPROPion (WELLBUTRIN XL) 150 MG 24 hr tablet Take 1 tablet (150 mg total) by mouth daily. 30 tablet 0  . levonorgestrel (MIRENA) 20 MCG/24HR IUD 1 each by Intrauterine route once.    . Vitamin D, Ergocalciferol, (DRISDOL) 50000 units CAPS capsule Take 1 capsule every Wednesday and Sunday wkly 24 capsule 10   No current facility-administered medications on file prior to visit.     Allergies: Codeine  Review of Systems: General:   Denies fever, chills, unexplained weight loss.  Optho/Auditory:   Denies visual changes,  blurred vision/LOV Respiratory:   Denies SOB, DOE more than baseline levels.  Cardiovascular:   Denies chest pain, palpitations, new onset peripheral edema  Gastrointestinal:   Denies nausea, vomiting, diarrhea.  Genitourinary: Denies dysuria, freq/ urgency, flank pain or discharge from genitals.  Endocrine:     Denies hot or cold intolerance, polyuria, polydipsia. Musculoskeletal:   Denies unexplained myalgias, joint swelling, unexplained arthralgias, gait problems.  Skin:  Denies rash, suspicious lesions Neurological:     Denies dizziness, unexplained weakness, numbness  Psychiatric/Behavioral:   Denies mood changes, suicidal or homicidal ideations, hallucinations    Objective:    Blood pressure 107/73, pulse 65, height 5\' 2"  (1.575 m), weight 208 lb 12.8 oz (94.7 kg), SpO2 98 %. Body mass index is 38.19 kg/m. General Appearance:    Alert, cooperative, no distress, appears stated age  Head:    Normocephalic, without obvious abnormality, atraumatic  Eyes:    PERRL, conjunctiva/corneas clear, EOM's intact, fundi    benign, both eyes  Ears:    Normal TM's and external ear canals, both ears  Nose:   Nares normal, septum midline, mucosa normal, no drainage    or sinus tenderness  Throat:   Lips w/o lesion, mucosa moist, and tongue normal; teeth and   gums normal  Neck:   Supple, symmetrical, trachea midline, no adenopathy;    thyroid:  no enlargement/tenderness/nodules; no carotid   bruit or JVD  Back:     Symmetric, no curvature, ROM normal, no CVA tenderness  Lungs:     Clear to auscultation bilaterally, respirations unlabored, no       Wh/ R/ R  Chest Wall:    No tenderness or gross deformity; normal excursion   Heart:    Regular rate and rhythm, S1 and S2 normal, no murmur, rub   or gallop  Breast Exam:   Deferred; patient recently examined at [facility].  Abdomen:     Soft, non-tender, bowel sounds active all four quadrants, NO   G/R/R, no masses, no organomegaly  Genitalia:     Deferred; patient recently examined at [facility].  Rectal:    Deferred; patient recently examined at [facility].  Extremities:   Extremities normal, atraumatic, no cyanosis or gross edema  Pulses:   2+ and symmetric all extremities  Skin:   Warm, dry, Skin color, texture, turgor normal, no obvious rashes or lesions Psych: No HI/SI, judgement and insight good, Euthymic mood. Full Affect.  Neurologic:   CNII-XII intact, normal strength,  sensation and reflexes    Throughout

## 2017-09-14 LAB — CYTOLOGY - PAP
Diagnosis: NEGATIVE
Diagnosis: REACTIVE
HPV: NOT DETECTED

## 2017-09-16 ENCOUNTER — Other Ambulatory Visit: Payer: BLUE CROSS/BLUE SHIELD

## 2017-09-16 ENCOUNTER — Other Ambulatory Visit: Payer: BLUE CROSS/BLUE SHIELD | Admitting: Obstetrics and Gynecology

## 2017-09-16 ENCOUNTER — Encounter: Payer: Self-pay | Admitting: Family Medicine

## 2017-09-16 LAB — COMPREHENSIVE METABOLIC PANEL
ALT: 30 IU/L (ref 0–32)
AST: 23 IU/L (ref 0–40)
Albumin/Globulin Ratio: 1.6 (ref 1.2–2.2)
Albumin: 4.6 g/dL (ref 3.5–5.5)
Alkaline Phosphatase: 63 IU/L (ref 39–117)
BUN/Creatinine Ratio: 17 (ref 9–23)
BUN: 12 mg/dL (ref 6–20)
Bilirubin Total: 0.4 mg/dL (ref 0.0–1.2)
CO2: 22 mmol/L (ref 20–29)
Calcium: 9.4 mg/dL (ref 8.7–10.2)
Chloride: 102 mmol/L (ref 96–106)
Creatinine, Ser: 0.72 mg/dL (ref 0.57–1.00)
GFR calc Af Amer: 128 mL/min/{1.73_m2} (ref >59)
GFR calc non Af Amer: 111 mL/min/{1.73_m2} (ref >59)
Globulin, Total: 2.8 g/dL (ref 1.5–4.5)
Glucose: 93 mg/dL (ref 65–99)
Potassium: 4.1 mmol/L (ref 3.5–5.2)
Sodium: 138 mmol/L (ref 134–144)
Total Protein: 7.4 g/dL (ref 6.0–8.5)

## 2017-09-16 LAB — INSULIN, FREE AND TOTAL
Free Insulin: 23 uU/mL — ABNORMAL HIGH
Total Insulin: 24 uU/mL

## 2017-09-16 LAB — CBC WITH DIFFERENTIAL/PLATELET
Basophils Absolute: 0 10*3/uL (ref 0.0–0.2)
Basos: 0 %
EOS (ABSOLUTE): 0.3 10*3/uL (ref 0.0–0.4)
Eos: 3 %
Hematocrit: 36.2 % (ref 34.0–46.6)
Hemoglobin: 11.8 g/dL (ref 11.1–15.9)
Immature Grans (Abs): 0 10*3/uL (ref 0.0–0.1)
Immature Granulocytes: 0 %
Lymphocytes Absolute: 3.4 10*3/uL — ABNORMAL HIGH (ref 0.7–3.1)
Lymphs: 35 %
MCH: 30 pg (ref 26.6–33.0)
MCHC: 32.6 g/dL (ref 31.5–35.7)
MCV: 92 fL (ref 79–97)
Monocytes Absolute: 0.5 10*3/uL (ref 0.1–0.9)
Monocytes: 6 %
Neutrophils Absolute: 5.5 10*3/uL (ref 1.4–7.0)
Neutrophils: 56 %
Platelets: 296 10*3/uL (ref 150–450)
RBC: 3.93 x10E6/uL (ref 3.77–5.28)
RDW: 11.8 % — ABNORMAL LOW (ref 12.3–15.4)
WBC: 9.8 10*3/uL (ref 3.4–10.8)

## 2017-09-16 LAB — LIPID PANEL
Chol/HDL Ratio: 3.3 ratio (ref 0.0–4.4)
Cholesterol, Total: 127 mg/dL (ref 100–199)
HDL: 39 mg/dL — ABNORMAL LOW (ref >39)
LDL Calculated: 70 mg/dL (ref 0–99)
Triglycerides: 92 mg/dL (ref 0–149)
VLDL Cholesterol Cal: 18 mg/dL (ref 5–40)

## 2017-09-16 LAB — HEMOGLOBIN A1C
Est. average glucose Bld gHb Est-mCnc: 105 mg/dL
Hgb A1c MFr Bld: 5.3 % (ref 4.8–5.6)

## 2017-09-16 LAB — VITAMIN D 25 HYDROXY (VIT D DEFICIENCY, FRACTURES): Vit D, 25-Hydroxy: 36.6 ng/mL (ref 30.0–100.0)

## 2017-09-16 LAB — TSH: TSH: 2.1 u[IU]/mL (ref 0.450–4.500)

## 2017-09-26 ENCOUNTER — Telehealth: Payer: Self-pay | Admitting: Obstetrics and Gynecology

## 2017-09-26 NOTE — Telephone Encounter (Signed)
Patient canceled her upcoming PUS appointment 09/30/17 with Dr.Jertson. Patient would like to reschedule.

## 2017-09-26 NOTE — Telephone Encounter (Signed)
Left message to call Taunya Goral at [phone].  

## 2017-09-30 ENCOUNTER — Other Ambulatory Visit: Payer: Self-pay

## 2017-09-30 ENCOUNTER — Other Ambulatory Visit: Payer: Self-pay | Admitting: Obstetrics and Gynecology

## 2017-09-30 NOTE — Telephone Encounter (Signed)
Left detailed message, ok per dpr. Advised returning call to assist with rescheduling recommended PUS. Please return call to [place], [place] [facility] at [phone].

## 2017-10-14 NOTE — Telephone Encounter (Signed)
Dr.Jertson, would you like me to send a letter at this time? 

## 2017-10-15 NOTE — Telephone Encounter (Signed)
Yes, please advise the patient that if she has a uterine anomaly, it could potentially be a contraindication to her IUD. Please send a letter and close the encounter.

## 2017-11-26 NOTE — Telephone Encounter (Signed)
Okay to close encounter?  °

## 2017-11-30 ENCOUNTER — Encounter: Payer: Self-pay | Admitting: *Deleted

## 2017-12-05 ENCOUNTER — Other Ambulatory Visit: Payer: Self-pay | Admitting: Family Medicine

## 2017-12-08 NOTE — Telephone Encounter (Signed)
Whow-  what has the patient been on then if we only filled it for 30 tabs and it has been almost 3 months? Please let me know especially since patient has been off it for 2 months or so, we probably should start at lower doses and then ramp up.

## 2017-12-08 NOTE — Telephone Encounter (Signed)
Adderall last filled 08/30/2017 for #30 and Wellbutrin last filled 09/09/2017 for # 30. LOV 09/12/2017 and patient is to follow up in 3 months per AVS.  Patient had appointment for 12/15/2017 that has been canceled.  Sent message to front desk to call patient and make follow up appointment. Please review and advise if refills approved before follow up. MPulliam, CMA/RT(R)

## 2017-12-09 NOTE — Telephone Encounter (Signed)
Called patient, she states that she still had medication left from previous RXs written by her previous provider.  Patient also states that she does not take Adderall during the summer.  Patient is currently taking both meds daily.  MPulliam, CMA/RT(R)

## 2017-12-10 MED ORDER — ADDERALL XR 30 MG PO CP24: 30.0000 mg | ORAL_CAPSULE | Freq: Every day | ORAL | 0 refills | Status: DC

## 2017-12-10 MED ORDER — BUPROPION HCL ER (XL) 150 MG PO TB24: 150.0000 mg | ORAL_TABLET | Freq: Every day | ORAL | 0 refills | Status: DC

## 2017-12-10 NOTE — Telephone Encounter (Signed)
Will need OV q 3 mo to assess ADD sx- she will need to  fill out evaluation paperwork for charting purposes and we need monitoring of controlled substance medications via OV q 3 mo

## 2017-12-11 ENCOUNTER — Telehealth: Payer: Self-pay | Admitting: Family Medicine

## 2017-12-11 NOTE — Telephone Encounter (Signed)
Noted MPulliam, CMA/RT(R)  

## 2017-12-11 NOTE — Telephone Encounter (Signed)
Provider required OV, left patient message ask for call back--glh

## 2017-12-11 NOTE — Telephone Encounter (Signed)
-----   Message from [place] Crane, CMA sent at 12/08/2017  4:54 PM EDT ----- Patient is due follow up, please call the patient to make an appointment.  Thanks. MPulliam, CMA/RT(R)

## 2017-12-15 ENCOUNTER — Telehealth: Payer: Self-pay | Admitting: Family Medicine

## 2017-12-15 ENCOUNTER — Ambulatory Visit: Payer: BLUE CROSS/BLUE SHIELD | Admitting: Family Medicine

## 2017-12-15 NOTE — Telephone Encounter (Signed)
-----   Message from Melissa D Pulliam, CMA sent at 12/08/2017  4:54 PM EDT ----- °Patient is due follow up, please call the patient to make an appointment.  Thanks. MPulliam, CMA/RT(R) ° °

## 2017-12-15 NOTE — Telephone Encounter (Signed)
Left pt message to set up, Provider required OV-- forwarding FYI to medical assistant.  --Fausto Skillern

## 2017-12-15 NOTE — Telephone Encounter (Signed)
Noted MPulliam, CMA/RT(R)  

## 2018-01-01 ENCOUNTER — Ambulatory Visit: Payer: BLUE CROSS/BLUE SHIELD | Admitting: Family Medicine

## 2018-01-15 ENCOUNTER — Other Ambulatory Visit: Payer: Self-pay | Admitting: Family Medicine

## 2018-01-15 ENCOUNTER — Encounter: Payer: Self-pay | Admitting: Emergency Medicine

## 2018-01-15 ENCOUNTER — Telehealth: Payer: Self-pay | Admitting: Obstetrics and Gynecology

## 2018-01-15 DIAGNOSIS — Z30431 Encounter for routine checking of intrauterine contraceptive device: Secondary | ICD-10-CM

## 2018-01-15 DIAGNOSIS — Z87718 Personal history of other specified (corrected) congenital malformations of genitourinary system: Secondary | ICD-10-CM

## 2018-01-15 NOTE — Telephone Encounter (Signed)
Mychart message sent to patient.  Scheduled ultrasound for 03/03/2018.  New order placed.  Routing to Prax[employer]Suzy Dixon for [employer].   Message as below:  Teon Hudnall, Marc Morgansracy L, RN To Otis DialsLisa Schwark Sent 01/15/2018 11:10 AM Joanna Campos.   We received your message to reschedule your pelvic ultrasound with Dr. Oscar LaJertson.   I have scheduled the appointment for 03/03/2018. Please arrive at 12:45 to check in. You do not need to have a full bladder for this exam. .   If you will have new insurance for the new year, please let our office know so we may check your benefits prior to this appointment.  If this day or time will not work for you, please let us know as soon as possible. Our office has an appointment cancellation policy for ultrasounds, you will need to cancel or reschedule within 72 business hours of appointment (3 business days) or you will have a $100.00 late cancellation fee placed to your account.    Please let us know if you have any questions.

## 2018-01-15 NOTE — Telephone Encounter (Signed)
Patient sent the following appointment request through MyChart. Routing to triage to assist with scheduling.  Appointment Request From: Joanna DialsLisa Campos    With Provider: Romualdo BolkJill Evelyn Jertson, MD Ginette Otto[[facility]]    Preferred Date Range: Any date 02/24/2018 or later    Preferred Times: Any time    Reason for visit: Request an Appointment    Comments:  Ultrasound for IUD placement

## 2018-01-16 NOTE — Telephone Encounter (Signed)
Patient last seen 08/01/2017 for chronic care- which was actually her first office visit with me.   We explained that she needs to be seen every 3 months for her ADD\ADHD medication and evaluation.  She was seen for a yrly physical but this was not for evaluation of her chronic medical conditions.  She will need an OV.

## 2018-01-16 NOTE — Telephone Encounter (Signed)
 Controlled Database report printed and given to Dr. Sharee Holsterpalski for review.  Tiajuana Amass. , CMA

## 2018-01-19 NOTE — Telephone Encounter (Signed)
LVM requesting pt call back to discuss.  T. Nelson, CMA 

## 2018-01-21 ENCOUNTER — Encounter: Payer: Self-pay | Admitting: Family Medicine

## 2018-01-21 ENCOUNTER — Ambulatory Visit (INDEPENDENT_AMBULATORY_CARE_PROVIDER_SITE_OTHER): Payer: BLUE CROSS/BLUE SHIELD | Admitting: Family Medicine

## 2018-01-21 VITALS — BP 115/75 | HR 80 | Temp 98.2°F | Ht 63.0 in | Wt 186.0 lb

## 2018-01-21 DIAGNOSIS — Z23 Encounter for immunization: Secondary | ICD-10-CM

## 2018-01-21 DIAGNOSIS — F419 Anxiety disorder, unspecified: Secondary | ICD-10-CM

## 2018-01-21 DIAGNOSIS — F909 Attention-deficit hyperactivity disorder, unspecified type: Secondary | ICD-10-CM | POA: Diagnosis not present

## 2018-01-21 DIAGNOSIS — E669 Obesity, unspecified: Secondary | ICD-10-CM

## 2018-01-21 MED ORDER — BUPROPION HCL 100 MG PO TABS: 100.0000 mg | ORAL_TABLET | Freq: Two times a day (BID) | ORAL | 0 refills | Status: DC

## 2018-01-21 MED ORDER — ADDERALL XR 30 MG PO CP24: 30.0000 mg | ORAL_CAPSULE | ORAL | 0 refills | Status: DC

## 2018-01-21 NOTE — Progress Notes (Signed)
ADHD/ ADD OV note   Impression and Recommendations:    1. Attention deficit hyperactivity disorder (ADHD), unspecified ADHD type   2. Anxiety   3. Class 2 obesity with body mass index (BMI) of 35 to 39.9 without comorbidity   4. Flu vaccine need     - Need for flu shot.  1. Attention Deficit Management - Prescription refilled today, Adderall 30 mg for use in the mornings.  - Per pt, states the 30 mg has been good, taking it just in the morning.  Lasts her until about 4-5 PM.   - Discontinued short acting 15 mg Adderall.   - While managing attention deficit, discussed importance of adequate hydration, increased physical activity, and continued avoidance of sugars and other carbohydrates.  2. Mood - Anxiety - Begin weaning down from Wellbutrin as recommended.  - Discussed and prescribed short-acting formula Wellbutrin today.  See med list today.  - Patient understands that if she finds she's not doing well on change in medications, she will call in and let us know.  3. Weight Loss - Patient is down to 186 lbs from 208 lbs in July (4 months ago).  - Pt continues using MyFitnessPal to track her food intake.  - Encouraged patient to continue with weight loss goals.  4. BMI Counseling - BMI of 33.0 Explained to patient what BMI refers to, and what it means medically.    Told patient to think about it as a "medical risk stratification measurement" and how increasing BMI is associated with increasing risk/ or worsening state of various diseases such as hypertension, hyperlipidemia, diabetes, premature OA, depression etc.  American Heart Association guidelines for healthy diet, basically Mediterranean diet, and exercise guidelines of 30 minutes 5 days per week or more discussed in detail.  Health counseling performed.  All questions answered.  5. Lifestyle & Preventative Health Maintenance - Advised patient to continue working toward exercising to improve overall mental,  physical, and emotional health.    - Reviewed the "spokes of the wheel" of mood and health management.  Stressed the importance of ongoing prudent habits, including regular exercise, appropriate sleep hygiene, healthful dietary habits, and prayer/meditation to relax.  - Encouraged patient to engage in daily physical activity, especially a formal exercise routine.  Recommended that the patient eventually strive for at least 150 minutes of moderate cardiovascular activity per week according to guidelines established by the [facility].   - Healthy dietary habits encouraged, including low-carb, and high amounts of lean protein in diet.   - Patient should also consume adequate amounts of water.   Education and routine counseling performed. Handouts provided if pt desired.   Orders Placed This Encounter  Procedures  . Flu Vaccine QUAD 6+ mos PF IM (Fluarix Quad PF)    Medications Discontinued During This Encounter  Medication Reason  . amphetamine-dextroamphetamine (ADDERALL) 15 MG tablet Patient Preference  . Vitamin D, Ergocalciferol, (DRISDOL) 50000 units CAPS capsule Completed Course  . buPROPion (WELLBUTRIN XL) 150 MG 24 hr tablet   . ADDERALL XR 30 MG 24 hr capsule Reorder     Orders Placed This Encounter  Procedures  . Flu Vaccine QUAD 6+ mos PF IM (Fluarix Quad PF)      Return needs photo, for ADD/ ADHD recheck every 33mo; dec wellbutrin from 150 QD to 100.   -Reminded patient the need for yearly complete physical exam office visits in addition to office visits for management of the chronic diseases  -Gross side  effects, risk and benefits, and alternatives of medications discussed with patient.  Patient is aware that all medications have potential side effects and we are unable to predict every side effect or drug-drug interaction that may occur.  Expresses verbal understanding and consents to current therapy plan and treatment regimen.   Please see AVS handed out to patient at the  end of our visit for further patient instructions/ counseling done pertaining to today's office visit.    Note:  This document was prepared using Dragon voice recognition software and may include unintentional dictation errors.  This document serves as a record of services personally performed by Thomasene Lot, DO. It was created on her behalf by Peggye Fothergill, a trained medical scribe. The creation of this record is based on the scribe's personal observations and the provider's statements to them.   I have reviewed the above medical documentation for accuracy and completeness and I concur.  Thomasene Lot, DO 01/21/2018 1:25 PM       _______________________________________________________   Subjective:  HPI: Joanna Campos y.o. female  presents for 3 month follow up for evaluation of our treatment plan for pt's ADD/ ADHD.  Mood Ready to wean off of Wellbutrin.  States "it's been 3 years, I've been doing really well."  Attention Deficit Management Ran out of meds on the 17th.  States the 30 has been good, taking it just in the morning.  Lasts her until about 4-5, and notes "that's when I'm good."  Recent Weight Loss Patient has lost 25 pounds since starting [facility] on August 12.  She weighed in at 208 lbs in July of 2019, and is now down to 186 lbs today.  Patient's goal is to lose 60 lbs, and be 150 lbs.  States that her husband works at [employer] and as a Sports administrator, getting exercise that way.  Patient is trying to walk.  Notes they cut out a lot of calories, sugar, and junk food from their diet.  "I feel so much better."  States they are using [facility] for accountability.  They go weekly for meal plans and check-ins.  Notes she eats a bagel thin in the mornings with peanut butter and banana.  They eat a lot of rotisserie chicken, berries, cauliflower, zucchini noodles, greek yogurt, etc.  Says sweets are her weakness, carbs are her husband's weakness.   No  problems updated.   Wt Readings from Last 3 Encounters:  01/21/18 186 lb (84.4 kg)  09/12/17 208 lb 12.8 oz (94.7 kg)  09/10/17 207 lb 9.6 oz (94.2 kg)    BMI Readings from Last 3 Encounters:  01/21/18 32.95 kg/m  09/12/17 38.19 kg/m  09/10/17 36.77 kg/m    BP Readings from Last 3 Encounters:  01/21/18 115/75  09/12/17 107/73  09/10/17 120/81     Review of Systems: General:   No F/C, wt loss Pulm:   No DIB, SOB, pleuritic chest pain Card:  No CP, palpitations Abd:  No n/v/d or pain Ext:  No inc edema from baseline   Objective: Physical Exam: BP 115/75   Pulse 80   Temp 98.2 F (36.8 C)   Ht 5\' 3"  (1.6 m)   Wt 186 lb (84.4 kg)   SpO2 98%   BMI 32.95 kg/m  Body mass index is 32.95 kg/m. General: Well nourished, in no apparent distress. Eyes: PERRLA, EOMs, conjunctiva clr no swelling or erythema Neck: supple Resp: Respiratory effort- normal, ECTA B/L w/o W/R/R  Cardio: RRR w/o MRGs. Skin:  Warm, dry without rashes, lesions, ecchymosis.  Neuro: Alert, Oriented Psych: Normal affect, Insight and Judgment appropriate.    Current Medications:  Current Outpatient Medications on File Prior to Visit  Medication Sig Dispense Refill  . Cholecalciferol (VITAMIN D3) 125 MCG (5000 UT) CAPS Take 1 capsule by mouth daily.    Marland Kitchen. levonorgestrel (MIRENA) 20 MCG/24HR IUD 1 each by Intrauterine route once.     No current facility-administered medications on file prior to visit.     Medical History:  Patient Active Problem List   Diagnosis Date Noted  . likely Metabolic syndrome 09/12/2017  . Gestational diabetes   . Hx gestational diabetes 08/01/2017  . Incompetent cervix 08/01/2017  . Mood disorder (HCC) 08/01/2017  . Attention deficit hyperactivity disorder (ADHD) 08/01/2017  . Class 2 obesity with body mass index (BMI) of 35 to 39.9 without comorbidity 08/01/2017  . Vitamin D deficiency 08/01/2017  . Family history of breast cancer-  paternal aunt in 3940s  08/01/2017  . Family history of early CAD 08/01/2017  . IUD (intrauterine device) in place 08/01/2017  . Bicornuate uterus affecting pregnancy, antepartum, third trimester 02/21/2014  . Recurrent urinary tract infection affecting pregnancy in third trimester 11/08/2013  . Previous preterm labor affecting pregnancy, antepartum 07/26/2013  . Anxiety 07/07/2012    Allergies:  Allergies  Allergen Reactions  . Codeine     Hallucinations/Vomiting     Family history-  Reviewed; changed as appropriate  Social history-  Reviewed; changed as appropriate

## 2018-02-05 ENCOUNTER — Encounter: Payer: Self-pay | Admitting: Obstetrics and Gynecology

## 2018-02-19 ENCOUNTER — Telehealth: Payer: Self-pay | Admitting: Obstetrics and Gynecology

## 2018-02-19 NOTE — Telephone Encounter (Signed)
Patient returned call. Spoke with patient regarding benefit for scheduled ultrasound appointment. Patient understood and agreeable. Patient scheduled 02/24/18 with Dr Oscar LaJertson. Patient aware of appointment date, arrival time and cancellation policy. No further questions. Will close encounter

## 2018-02-19 NOTE — Telephone Encounter (Signed)
Call placed to patient to review benefits for ultrasound appointment scheduled on 02/24/18. Left voicemail message requesting a return call

## 2018-02-23 NOTE — Progress Notes (Signed)
GYNECOLOGY  VISIT   HPI: 32 y.o.   Married White or Caucasian Not Hispanic or Latino  female   [id number] with No LMP recorded. (Menstrual status: IUD).   here for consult following PUS.    GYNECOLOGIC HISTORY: No LMP recorded. (Menstrual status: IUD). Contraception: IUD Menopausal hormone therapy: None        OB History    Gravida  2   Para  2   Term  2   Preterm  0   AB  0   Living  2     SAB  0   TAB  0   Ectopic  0   Multiple  0   Live Births  2              Patient Active Problem List   Diagnosis Date Noted  . likely Metabolic syndrome 09/12/2017  . Gestational diabetes   . Hx gestational diabetes 08/01/2017  . Incompetent cervix 08/01/2017  . Mood disorder (HCC) 08/01/2017  . Attention deficit hyperactivity disorder (ADHD) 08/01/2017  . Class 2 obesity with body mass index (BMI) of 35 to 39.9 without comorbidity 08/01/2017  . Vitamin D deficiency 08/01/2017  . Family history of breast cancer-  paternal aunt in 6340s 08/01/2017  . Family history of early CAD 08/01/2017  . IUD (intrauterine device) in place 08/01/2017  . Bicornuate uterus affecting pregnancy, antepartum, third trimester 02/21/2014  . Recurrent urinary tract infection affecting pregnancy in third trimester 11/08/2013  . Previous preterm labor affecting pregnancy, antepartum 07/26/2013  . Anxiety 07/07/2012    Past Medical History:  Diagnosis Date  . ADD (attention deficit disorder)   . Anemia   . Bicornate uterus   . Gestational diabetes     Past Surgical History:  Procedure Laterality Date  . BREAST SURGERY    . CHOLECYSTECTOMY      Current Outpatient Medications  Medication Sig Dispense Refill  . ADDERALL XR 30 MG 24 hr capsule Take 1 capsule (30 mg total) by mouth every morning. 90 capsule 0  . buPROPion (WELLBUTRIN) 100 MG tablet Take 1 tablet (100 mg total) by mouth 2 (two) times daily. 180 tablet 0  . Cholecalciferol (VITAMIN D3) 125 MCG (5000 UT) CAPS Take 1 capsule  by mouth daily.    Marland Kitchen. levonorgestrel (MIRENA) 20 MCG/24HR IUD 1 each by Intrauterine route once.     No current facility-administered medications for this visit.      ALLERGIES: Codeine  Family History  Problem Relation Age of Onset  . Heart disease Father   . Stroke Maternal Grandfather     Social History   Socioeconomic History  . Marital status: Married    Spouse name: Not on file  . Number of children: Not on file  . Years of education: Not on file  . Highest education level: Not on file  Occupational History  . Not on file  Social Needs  . Financial resource strain: Not on file  . Food insecurity:    Worry: Not on file    Inability: Not on file  . Transportation needs:    Medical: Not on file    Non-medical: Not on file  Tobacco Use  . Smoking status: Never Smoker  . Smokeless tobacco: Never Used  Substance and Sexual Activity  . Alcohol use: No  . Drug use: No  . Sexual activity: Yes    Birth control/protection: I.U.D.    Comment: Mirena placed in 2016  Lifestyle  . Physical activity:  Days per week: Not on file    Minutes per session: Not on file  . Stress: Not on file  Relationships  . Social connections:    Talks on phone: Not on file    Gets together: Not on file    Attends religious service: Not on file    Active member of club or organization: Not on file    Attends meetings of clubs or organizations: Not on file    Relationship status: Not on file  . Intimate partner violence:    Fear of current or ex partner: Not on file    Emotionally abused: Not on file    Physically abused: Not on file    Forced sexual activity: Not on file  Other Topics Concern  . Not on file  Social History Narrative  . Not on file    ROS  The patient was not seen. I called her and reviewed the ultrasound findings. No signs of a uterine anomaly, IUD in place.

## 2018-02-24 ENCOUNTER — Encounter: Payer: Self-pay | Admitting: Obstetrics and Gynecology

## 2018-02-24 ENCOUNTER — Ambulatory Visit (INDEPENDENT_AMBULATORY_CARE_PROVIDER_SITE_OTHER): Payer: BLUE CROSS/BLUE SHIELD | Admitting: Obstetrics and Gynecology

## 2018-02-24 ENCOUNTER — Other Ambulatory Visit: Payer: Self-pay

## 2018-02-24 ENCOUNTER — Ambulatory Visit (INDEPENDENT_AMBULATORY_CARE_PROVIDER_SITE_OTHER): Payer: BLUE CROSS/BLUE SHIELD

## 2018-02-24 ENCOUNTER — Other Ambulatory Visit: Payer: Self-pay | Admitting: Family Medicine

## 2018-02-24 VITALS — BP 110/68 | HR 60 | Wt 189.0 lb

## 2018-02-24 DIAGNOSIS — F909 Attention-deficit hyperactivity disorder, unspecified type: Secondary | ICD-10-CM

## 2018-02-24 DIAGNOSIS — F419 Anxiety disorder, unspecified: Secondary | ICD-10-CM

## 2018-02-24 DIAGNOSIS — Z87718 Personal history of other specified (corrected) congenital malformations of genitourinary system: Secondary | ICD-10-CM

## 2018-02-24 DIAGNOSIS — Z30431 Encounter for routine checking of intrauterine contraceptive device: Secondary | ICD-10-CM | POA: Diagnosis not present

## 2018-02-24 MED ORDER — ADDERALL XR 30 MG PO CP24: 30.0000 mg | ORAL_CAPSULE | ORAL | 0 refills | Status: DC

## 2018-02-24 NOTE — Telephone Encounter (Signed)
Controlled Database reviewed.  No aberrancies noted.  Please review and refill if appropriate.  T. Nelson, CMA 

## 2018-03-03 ENCOUNTER — Other Ambulatory Visit: Payer: Self-pay | Admitting: Obstetrics and Gynecology

## 2018-03-03 ENCOUNTER — Other Ambulatory Visit: Payer: Self-pay

## 2018-04-02 ENCOUNTER — Ambulatory Visit: Payer: BLUE CROSS/BLUE SHIELD | Admitting: Family Medicine

## 2018-04-06 ENCOUNTER — Ambulatory Visit (INDEPENDENT_AMBULATORY_CARE_PROVIDER_SITE_OTHER): Payer: BLUE CROSS/BLUE SHIELD | Admitting: Family Medicine

## 2018-04-06 ENCOUNTER — Telehealth: Payer: Self-pay | Admitting: Family Medicine

## 2018-04-06 ENCOUNTER — Encounter: Payer: Self-pay | Admitting: Family Medicine

## 2018-04-06 VITALS — BP 122/83 | HR 87 | Temp 98.0°F | Ht 63.0 in | Wt 182.3 lb

## 2018-04-06 DIAGNOSIS — F39 Unspecified mood [affective] disorder: Secondary | ICD-10-CM | POA: Diagnosis not present

## 2018-04-06 DIAGNOSIS — E669 Obesity, unspecified: Secondary | ICD-10-CM | POA: Diagnosis not present

## 2018-04-06 DIAGNOSIS — F419 Anxiety disorder, unspecified: Secondary | ICD-10-CM

## 2018-04-06 DIAGNOSIS — F909 Attention-deficit hyperactivity disorder, unspecified type: Secondary | ICD-10-CM | POA: Diagnosis not present

## 2018-04-06 DIAGNOSIS — E66811 Obesity, class 1: Secondary | ICD-10-CM | POA: Insufficient documentation

## 2018-04-06 MED ORDER — ADDERALL XR 30 MG PO CP24: 30.0000 mg | ORAL_CAPSULE | ORAL | 0 refills | Status: DC

## 2018-04-06 MED ORDER — BUPROPION HCL 100 MG PO TABS: 100.0000 mg | ORAL_TABLET | Freq: Two times a day (BID) | ORAL | 1 refills | Status: DC

## 2018-04-06 NOTE — Progress Notes (Signed)
Impression and Recommendations:    1. Attention deficit hyperactivity disorder (ADHD), unspecified ADHD type   2. Anxiety   3. Mood Chronic  4. Obesity, Class I, BMI 30-34.9       1. ADHD -Patient on 30 mg Adderall, tolerating well.   2. GAD -Patient on 100 mg Wellbutrin, tolerating well  Joanna Campos has been doing well on the medication changes (30 mg Adderall and 100 mg Wellbutrin).   3. Weight Loss -Joanna Campos has lost 32 lbs since August 2019 and her goal is to lose 20 more lbs.  -Joanna Campos is using meal apps and eating a low caloric diet (1,100 calories per day)    Follow up in 3 months    No orders of the defined types were placed in this encounter.   Meds ordered this encounter  Medications  . ADDERALL XR 30 MG 24 hr capsule    Sig: Take 1 capsule (30 mg total) by mouth every morning.    Dispense:  90 capsule    Refill:  0  . buPROPion (WELLBUTRIN) 100 MG tablet    Sig: Take 1 tablet (100 mg total) by mouth 2 (two) times daily.    Dispense:  180 tablet    Refill:  1    Medications Discontinued During This Encounter  Medication Reason  . ADDERALL XR 30 MG 24 hr capsule Reorder  . buPROPion (WELLBUTRIN) 100 MG tablet Reorder     Gross side effects, risk and benefits, and alternatives of medications and treatment plan in general discussed with patient.  Patient is aware that all medications have potential side effects and we are unable to predict every side effect or drug-drug interaction that may occur.   Patient will call with any questions prior to using medication if they have concerns.    Expresses verbal understanding and consents to current therapy and treatment regimen.  No barriers to understanding were identified.  Red flag symptoms and signs discussed in detail.  Patient expressed understanding regarding what to do in case of emergency\urgent symptoms  Please see AVS handed out to patient at the end of our visit for further patient instructions/ counseling done  pertaining to today's office visit.   Return for ADD/ ADHD recheck every 56mo.     Note:  This note was prepared with assistance of Dragon voice recognition software. Occasional wrong-word or sound-a-like substitutions may have occurred due to the inherent limitations of voice recognition software.   This document serves as a record of services personally performed by Thomasene Lot, DO. It was created on her behalf by Chestine Spore, a trained medical scribe. The creation of this record is based on the scribe's personal observations and the provider's statements to them.   I have reviewed the above medical documentation for accuracy and completeness and I concur.  Thomasene Lot, DO 04/08/2018 9:36 PM       --------------------------------------------------------------------------------------------------------------------------------------------------------------------------------------------------------------------------------------------    Subjective:     HPI: Joanna Campos is a 33 y.o. female who presents to [facility] at [facility] today for issues as discussed below.  Mood:  Joanna Campos has been doing well on Wellbutrin and Joanna Campos has been tolerating well.   ADHD:  Joanna Campos has been doing well on Adderall. Joanna Campos has been eating better and exercising more and it has helped her.    Weight loss:  Joanna Campos has lost 32 lbs since August 2019 and her goal is to lose 20 more lbs. Joanna Campos has been increasing her  exercise as well. Joanna Campos is on a 1,100 calorie diet now and Joanna Campos is not starving with this calorie intake. Joanna Campos cut out sodas. Joanna Campos goes to the weight loss clinic to weigh in every month.    Wt Readings from Last 3 Encounters:  04/06/18 182 lb 4.8 oz (82.7 kg)  02/24/18 189 lb (85.7 kg)  01/21/18 186 lb (84.4 kg)   BP Readings from Last 3 Encounters:  04/06/18 122/83  02/24/18 110/68  01/21/18 115/75   Pulse Readings from Last 3 Encounters:  04/06/18 87  02/24/18 60  01/21/18  80   BMI Readings from Last 3 Encounters:  04/06/18 32.29 kg/m  02/24/18 33.48 kg/m  01/21/18 32.95 kg/m     Patient Care Team    Relationship Specialty Notifications Start End  Thomasene Lot, DO PCP - General Family Medicine  07/25/17      Patient Active Problem List   Diagnosis Date Noted  . Obesity, Class I, BMI 30-34.9 04/06/2018  . likely Metabolic syndrome 09/12/2017  . Gestational diabetes   . Hx gestational diabetes 08/01/2017  . Incompetent cervix 08/01/2017  . Mood disorder (HCC) 08/01/2017  . Attention deficit hyperactivity disorder (ADHD) 08/01/2017  . Class 2 obesity with body mass index (BMI) of 35 to 39.9 without comorbidity 08/01/2017  . Vitamin D deficiency 08/01/2017  . Family history of breast cancer-  paternal aunt in 18s 08/01/2017  . Family history of early CAD 08/01/2017  . IUD (intrauterine device) in place 08/01/2017  . Bicornuate uterus affecting pregnancy, antepartum, third trimester 02/21/2014  . Recurrent urinary tract infection affecting pregnancy in third trimester 11/08/2013  . Previous preterm labor affecting pregnancy, antepartum 07/26/2013  . Anxiety 07/07/2012    Past Medical history, Surgical history, Family history, Social history, Allergies and Medications have been entered into the medical record, reviewed and changed as needed.    Current Meds  Medication Sig  . buPROPion (WELLBUTRIN) 100 MG tablet Take 1 tablet (100 mg total) by mouth 2 (two) times daily.  . Cholecalciferol (VITAMIN D3) 125 MCG (5000 UT) CAPS Take 1 capsule by mouth daily.  Marland Kitchen levonorgestrel (MIRENA) 20 MCG/24HR IUD 1 each by Intrauterine route once.  . [DISCONTINUED] buPROPion (WELLBUTRIN) 100 MG tablet Take 1 tablet (100 mg total) by mouth 2 (two) times daily.    Allergies:  Allergies  Allergen Reactions  . Codeine     Hallucinations/Vomiting     Review of Systems:  A fourteen system review of systems was performed and found to be positive as per  HPI.   Objective:   Blood pressure 122/83, pulse 87, temperature 98 F (36.7 C), height 5\' 3"  (1.6 m), weight 182 lb 4.8 oz (82.7 kg), SpO2 100 %. Body mass index is 32.29 kg/m. General:  Well Developed, well nourished, appropriate for stated age.  Neuro:  Alert and oriented,  extra-ocular muscles intact  HEENT:  Normocephalic, atraumatic, neck supple, no carotid bruits appreciated  Skin:  no gross rash, warm, pink. Cardiac:  RRR, S1 S2 Respiratory:  ECTA B/L and A/P, Not using accessory muscles, speaking in full sentences- unlabored. Vascular:  Ext warm, no cyanosis apprec.; cap RF less 2 sec. Psych:  No HI/SI, judgement and insight good, Euthymic mood. Full Affect.

## 2018-04-06 NOTE — Telephone Encounter (Signed)
[facility] called after receiving 90 day supply for Adderall, patient's insurance only allows them to release 30 day supply for this med and the remaining 60 will be voided out. Patient will need a new order sent again next month to continue with this med

## 2018-06-09 ENCOUNTER — Other Ambulatory Visit: Payer: Self-pay | Admitting: Family Medicine

## 2018-06-09 DIAGNOSIS — F909 Attention-deficit hyperactivity disorder, unspecified type: Secondary | ICD-10-CM

## 2018-06-09 NOTE — Telephone Encounter (Signed)
LOV 04/06/2018 and RX written at that time for 90 - due to insurance patient can only get 30 days at a time.  Verified with the pharmacy and ran [place] controlled substance database that the med was filled for 30.  Pharmacy states that remaining 60 was voided and they will send written confirmation. Please review and advise. MPulliam, CMA/RT(R)

## 2018-06-09 NOTE — Telephone Encounter (Signed)
Once you show me the canceled out prescription from [place], I will refill.

## 2018-06-10 MED ORDER — ADDERALL XR 30 MG PO CP24: 30.0000 mg | ORAL_CAPSULE | ORAL | 0 refills | Status: DC

## 2018-06-10 NOTE — Telephone Encounter (Signed)
Received written confirmation from the pharmacy. MPulliam, CMA/RT(R)

## 2018-07-15 ENCOUNTER — Other Ambulatory Visit: Payer: Self-pay | Admitting: Family Medicine

## 2018-07-15 DIAGNOSIS — F909 Attention-deficit hyperactivity disorder, unspecified type: Secondary | ICD-10-CM

## 2018-07-15 NOTE — Telephone Encounter (Signed)
LOV 04/06/18 - patient to follow up in 3 months -  Front desk to call to make appointment.  Medication last filled 06/10/2018 for 30 days. MPulliam, CMA/RT(R)

## 2018-08-03 ENCOUNTER — Other Ambulatory Visit: Payer: Self-pay

## 2018-08-03 ENCOUNTER — Ambulatory Visit (INDEPENDENT_AMBULATORY_CARE_PROVIDER_SITE_OTHER): Payer: BC Managed Care – PPO | Admitting: Family Medicine

## 2018-08-03 ENCOUNTER — Encounter: Payer: Self-pay | Admitting: Family Medicine

## 2018-08-03 VITALS — HR 78 | Temp 97.9°F | Ht 63.0 in | Wt 180.0 lb

## 2018-08-03 DIAGNOSIS — E66811 Obesity, class 1: Secondary | ICD-10-CM | POA: Insufficient documentation

## 2018-08-03 DIAGNOSIS — F909 Attention-deficit hyperactivity disorder, unspecified type: Secondary | ICD-10-CM

## 2018-08-03 DIAGNOSIS — F419 Anxiety disorder, unspecified: Secondary | ICD-10-CM

## 2018-08-03 DIAGNOSIS — E669 Obesity, unspecified: Secondary | ICD-10-CM | POA: Diagnosis not present

## 2018-08-03 MED ORDER — AMPHETAMINE-DEXTROAMPHET ER 30 MG PO CP24: 30.0000 mg | ORAL_CAPSULE | Freq: Every day | ORAL | 0 refills | Status: DC

## 2018-08-03 MED ORDER — BUPROPION HCL ER (XL) 150 MG PO TB24: 150.0000 mg | ORAL_TABLET | ORAL | 0 refills | Status: DC

## 2018-08-03 MED ORDER — ADDERALL XR 30 MG PO CP24: 30.0000 mg | ORAL_CAPSULE | ORAL | 0 refills | Status: DC

## 2018-08-03 NOTE — Progress Notes (Signed)
Telehealth office visit note for Joanna Campos Campos, D.O- at Primary Care at [facility]   I connected with current patient today and verified that I am speaking with the correct person using two identifiers.   . Location of the patient: Home . Location of the provider: Office Only the patient (+/- their family members at pt's discretion) and myself were participating in the encounter    - This visit type was conducted due to national recommendations for restrictions regarding the COVID-19 Pandemic (e.g. social distancing) in an effort to limit this patient's exposure and mitigate transmission in our community.  This format is felt to be most appropriate for this patient at this time.   - The patient did not have access to video technology or had technical difficulties with video requiring transitioning to audio format only. - No physical exam could be performed with this format, beyond that communicated to [place] by the patient/ family members as noted.   - Additionally my office staff/ schedulers discussed with the patient that there may be a monetary charge related to this service, depending on their medical insurance.   The patient expressed understanding, and agreed to proceed.    Impression and Recommendations:    1. Attention deficit hyperactivity disorder (ADHD), unspecified ADHD type   2. Anxiety   3. Class 1 obesity in adult, unspecified BMI, unspecified obesity type, unspecified whether serious comorbidity present    1. Attention deficit hyperactivity disorder (ADHD), unspecified ADHD type -Well-controlled.  Continue medications.  Continue to be consistent with the meds. - ADDERALL XR 30 MG 24 hr capsule; Take 1 capsule (30 mg total) by mouth every morning for 30 days.  Dispense: 30 capsule; Refill: 0 - amphetamine-dextroamphetamine (ADDERALL XR) 30 MG 24 hr capsule; Take 1 capsule (30 mg total) by mouth daily.  Dispense: 30 capsule; Refill: 0 - amphetamine-dextroamphetamine  (ADDERALL XR) 30 MG 24 hr capsule; Take 1 capsule (30 mg total) by mouth daily for 30 days.  Dispense: 30 capsule; Refill: 0  2. Anxiety -Per patient the Adderall helps control her anxiety.  She feels calmer and more focused and hence that makes her less anxious. -Continue Adderall same dose -We will transfer patient back to the XL formula of Wellbutrin since she often forgets to take the second 12-hour tablet in the evenings. -Continue to exercise 3 days a week or more which she has been doing-walking. -Continuing to create to do this and checking them off etc. - ADDERALL XR 30 MG 24 hr capsule; Take 1 capsule (30 mg total) by mouth every morning for 30 days.  Dispense: 30 capsule; Refill: 0 - amphetamine-dextroamphetamine (ADDERALL XR) 30 MG 24 hr capsule; Take 1 capsule (30 mg total) by mouth daily.  Dispense: 30 capsule; Refill: 0 - amphetamine-dextroamphetamine (ADDERALL XR) 30 MG 24 hr capsule; Take 1 capsule (30 mg total) by mouth daily for 30 days.  Dispense: 30 capsule; Refill: 0 - buPROPion (WELLBUTRIN XL) 150 MG 24 hr tablet; Take 1 tablet (150 mg total) by mouth every morning.  Dispense: 90 tablet; Refill: 0  3. Class 1 obesity in adult, unspecified BMI, unspecified obesity type, unspecified whether serious comorbidity present -Patient will [facility].  Reminded her she has had a 28 pound weight loss in the past year however.  She has done great. - buPROPion (WELLBUTRIN XL) 150 MG 24 hr tablet; Take 1 tablet (150 mg total) by mouth every morning.  Dispense: 90 tablet; Refill: 0  Meds ordered this encounter  Medications  . ADDERALL XR 30 MG 24 hr capsule    Sig: Take 1 capsule (30 mg total) by mouth every morning for 30 days.    Dispense:  30 capsule    Refill:  0  . amphetamine-dextroamphetamine (ADDERALL XR) 30 MG 24 hr capsule    Sig: Take 1 capsule (30 mg total) by mouth daily.    Dispense:  30 capsule    Refill:  0  .  amphetamine-dextroamphetamine (ADDERALL XR) 30 MG 24 hr capsule    Sig: Take 1 capsule (30 mg total) by mouth daily for 30 days.    Dispense:  30 capsule    Refill:  0  . buPROPion (WELLBUTRIN XL) 150 MG 24 hr tablet    Sig: Take 1 tablet (150 mg total) by mouth every morning.    Dispense:  90 tablet    Refill:  0    Medications Discontinued During This Encounter  Medication Reason  . buPROPion (WELLBUTRIN) 100 MG tablet   . ADDERALL XR 30 MG 24 hr capsule Reorder     Gross side effects, risk and benefits, and alternatives of medications and treatment plan in general discussed with patient.  Patient is aware that all medications have potential side effects and we are unable to predict every side effect or drug-drug interaction that may occur.   Patient will call with any questions prior to using medication if they have concerns.    Expresses verbal understanding and consents to current therapy and treatment regimen.  No barriers to understanding were identified.  Red flag symptoms and signs discussed in detail.  Patient expressed understanding regarding what to do in case of emergency\urgent symptoms  Please see AVS handed out to patient at the end of our visit for further patient instructions/ counseling done pertaining to today's office visit.   Return for 105mo in-person for CPE w FBW & ADHD f/up .     Note:  This note was prepared with assistance of Dragon voice recognition software. Occasional wrong-word or sound-a-like substitutions may have occurred due to the inherent limitations of voice recognition software.      --------------------------------------------------------------------------------------------------------------------------------------------------------------------------------------------------------------------------------------------    Subjective:     HPI: Joanna Campos Campos is a 33 y.o. female who presents to [facility] today for  issues as discussed below.   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   Now done w school.   A lot of stress, but now since that is over- much better.   ADHD:  She has been religious about taking her adderall.  Since March, no skipped doses.   Also, getting MUCH more accomplished.     GAD- forgets them at night a lot- her Wellbutrin q 12 hr.   -patient thinks it works well for her anxiety and ADHD however she would like to go back to the XL formula and up a little.  She feels like the 150 mg XL worked better than the 100mg  12-hour tablets, specially since she skips the second dose   [facility] - [facility], now she is down to 180.  She has not been going to [facility] due to the COVID however she will get back on track in the very near future as they are opening up appointments now for patients.   GAD 7 : Generalized Anxiety Score 08/03/2018 04/06/2018 01/21/2018  Nervous, Anxious, on  Edge 0 0 0  Control/stop worrying 0 0 1  Worry too much - different things 1 0 1  Trouble relaxing 1 0 1  Restless 0 0 0  Easily annoyed or irritable 1 0 1  Afraid - awful might happen 0 0 0  Total GAD 7 Score 3 0 4  Anxiety Difficulty Somewhat difficult Not difficult at all -     Wt Readings from Last 3 Encounters:  08/03/18 180 lb (81.6 kg)  04/06/18 182 lb 4.8 oz (82.7 kg)  02/24/18 189 lb (85.7 kg)   BP Readings from Last 3 Encounters:  04/06/18 122/83  02/24/18 110/68  01/21/18 115/75   Pulse Readings from Last 3 Encounters:  08/03/18 78  04/06/18 87  02/24/18 60   BMI Readings from Last 3 Encounters:  08/03/18 31.89 kg/m  04/06/18 32.29 kg/m  02/24/18 33.48 kg/m     Patient Care Team    Relationship Specialty Notifications Start End  Thomasene Lotpalski, Wilma Michaelson, DO PCP - General Family Medicine  07/25/17      Patient Active Problem List   Diagnosis Date Noted  . Class 1 obesity in adult 08/03/2018  . Obesity, Class I, BMI 30-34.9  04/06/2018  . likely Metabolic syndrome 09/12/2017  . Gestational diabetes   . Hx gestational diabetes 08/01/2017  . Incompetent cervix 08/01/2017  . Mood disorder (HCC) 08/01/2017  . Attention deficit hyperactivity disorder (ADHD) 08/01/2017  . Class 2 obesity with body mass index (BMI) of 35 to 39.9 without comorbidity 08/01/2017  . Vitamin D deficiency 08/01/2017  . Family history of breast cancer-  paternal aunt in 9040s 08/01/2017  . Family history of early CAD 08/01/2017  . IUD (intrauterine device) in place 08/01/2017  . Bicornuate uterus affecting pregnancy, antepartum, third trimester 02/21/2014  . Recurrent urinary tract infection affecting pregnancy in third trimester 11/08/2013  . Previous preterm labor affecting pregnancy, antepartum 07/26/2013  . Anxiety 07/07/2012    Past Medical history, Surgical history, Family history, Social history, Allergies and Medications have been entered into the medical record, reviewed and changed as needed.    Current Meds  Medication Sig  . ADDERALL XR 30 MG 24 hr capsule Take 1 capsule (30 mg total) by mouth every morning for 30 days.  . Cholecalciferol (VITAMIN D3) 125 MCG (5000 UT) CAPS Take 1 capsule by mouth daily.  Marland Kitchen. levonorgestrel (MIRENA) 20 MCG/24HR IUD 1 each by Intrauterine route once.  . [DISCONTINUED] ADDERALL XR 30 MG 24 hr capsule Take 1 capsule (30 mg total) by mouth every morning for 30 days.  . [DISCONTINUED] buPROPion (WELLBUTRIN) 100 MG tablet Take 1 tablet (100 mg total) by mouth 2 (two) times daily.    Allergies:  Allergies  Allergen Reactions  . Codeine     Hallucinations/Vomiting    Review of Systems: General:   No F/C, wt loss Pulm:   No DIB, SOB, pleuritic chest pain Card:  No CP, palpitations Abd:  No n/v/d or pain Ext:  No inc edema from baseline  Objective:   Pulse 78, temperature 97.9 F (36.6 C), height 5\' 3"  (1.6 m), weight 180 lb (81.6 kg).  -Patient was unable to obtain other vitals due to  being at home. Body mass index is 31.89 kg/m.  General: Sounds in no acute distress, sounds as usual Skin: Pt confirms warm and dry extremities and pink fingertips Respiratory: Speaking in full sentences, no conversational dyspnea Psych: A and O *3, appears insight good, mood- seems full

## 2018-09-15 ENCOUNTER — Other Ambulatory Visit: Payer: Self-pay | Admitting: Family Medicine

## 2018-09-15 DIAGNOSIS — F909 Attention-deficit hyperactivity disorder, unspecified type: Secondary | ICD-10-CM

## 2018-09-15 DIAGNOSIS — E66811 Obesity, class 1: Secondary | ICD-10-CM

## 2018-09-15 DIAGNOSIS — F419 Anxiety disorder, unspecified: Secondary | ICD-10-CM

## 2018-09-15 DIAGNOSIS — E669 Obesity, unspecified: Secondary | ICD-10-CM

## 2018-09-15 MED ORDER — BUPROPION HCL ER (XL) 150 MG PO TB24: 150.0000 mg | ORAL_TABLET | ORAL | 0 refills | Status: DC

## 2018-09-15 MED ORDER — ADDERALL XR 30 MG PO CP24: 30.0000 mg | ORAL_CAPSULE | ORAL | 0 refills | Status: DC

## 2018-12-13 ENCOUNTER — Encounter: Payer: Self-pay | Admitting: Family Medicine

## 2018-12-17 ENCOUNTER — Ambulatory Visit (INDEPENDENT_AMBULATORY_CARE_PROVIDER_SITE_OTHER): Payer: BC Managed Care – PPO | Admitting: Family Medicine

## 2018-12-17 ENCOUNTER — Encounter: Payer: Self-pay | Admitting: Family Medicine

## 2018-12-17 ENCOUNTER — Other Ambulatory Visit: Payer: Self-pay

## 2018-12-17 DIAGNOSIS — F419 Anxiety disorder, unspecified: Secondary | ICD-10-CM | POA: Diagnosis not present

## 2018-12-17 DIAGNOSIS — F909 Attention-deficit hyperactivity disorder, unspecified type: Secondary | ICD-10-CM

## 2018-12-17 DIAGNOSIS — E669 Obesity, unspecified: Secondary | ICD-10-CM | POA: Diagnosis not present

## 2018-12-17 MED ORDER — AMPHETAMINE-DEXTROAMPHET ER 30 MG PO CP24: 30.0000 mg | ORAL_CAPSULE | Freq: Every day | ORAL | 0 refills | Status: DC

## 2018-12-17 MED ORDER — BUPROPION HCL ER (XL) 150 MG PO TB24: 150.0000 mg | ORAL_TABLET | ORAL | 1 refills | Status: DC

## 2018-12-17 NOTE — Progress Notes (Signed)
Telehealth office visit note for Joanna Campos, D.O- at [facility] at [facility]   I connected with current patient today and verified that I am speaking with the correct person using two identifiers.   . Location of the patient: Home . Location of the provider: Office Only the patient (+/- their family members at pt's discretion) and myself were participating in the encounter - This visit type was conducted due to national recommendations for restrictions regarding the COVID-19 Pandemic (e.g. social distancing) in an effort to limit this patient's exposure and mitigate transmission in our community.  This format is felt to be most appropriate for this patient at this time.   - The patient did not have access to video technology or had technical difficulties with video requiring transitioning to audio format only. - No physical exam could be performed with this format, beyond that communicated to [place] by the patient/ family members as noted.   - Additionally my office staff/ schedulers discussed with the patient that there may be a monetary charge related to this service, depending on their medical insurance.   The patient expressed understanding, and agreed to proceed.       History of Present Illness:  Notes "everybody is happy because we're back in school."  - Attention Deficit Disorder Notes didn't come in for follow-up in September because school started and she got busy.  She didn't run out of meds because she hasn't been taking her Adderall on the weekends, so she "literally had six pills left."  Confirms her Adderall is working well.  - Mood Notes doing the XR "for the other meds (wellbutrin)" is much better, because she can more easily remember to take it.  Doing well and denies concerns.   GAD 7 : Generalized Anxiety Score 08/03/2018 04/06/2018 01/21/2018  Nervous, Anxious, on Edge 0 0 0  Control/stop worrying 0 0 1  Worry too much - different things 1 0 1   Trouble relaxing 1 0 1  Restless 0 0 0  Easily annoyed or irritable 1 0 1  Afraid - awful might happen 0 0 0  Total GAD 7 Score 3 0 4  Anxiety Difficulty Somewhat difficult Not difficult at all -    Depression screen [facility] 2/9 08/03/2018 04/06/2018 01/21/2018 09/12/2017 08/01/2017  Decreased Interest 0 0 0 0 0  Down, Depressed, Hopeless 0 0 0 0 0  PHQ - 2 Score 0 0 0 0 0  Altered sleeping 0 0 0 0 0  Tired, decreased energy 0 0 0 0 0  Change in appetite 0 0 0 0 0  Feeling bad or failure about yourself  0 0 0 0 0  Trouble concentrating 0 0 0 0 0  Moving slowly or fidgety/restless 0 0 0 0 0  Suicidal thoughts 0 0 0 0 0  PHQ-9 Score 0 0 0 0 0  Difficult doing work/chores Not difficult at all Not difficult at all Not difficult at all - Not difficult at all      Impression and Recommendations:    1. Attention deficit hyperactivity disorder (ADHD), unspecified ADHD type   2. Anxiety   3. Class 1 obesity in adult, unspecified BMI, unspecified obesity type, unspecified whether serious comorbidity present      - Last visit 08/03/2018.   Attention Deficit Hyperactivity Disorder - Stable on current management, Adderall. - Patient tolerating meds well without S-E. - Continue treatment plan as prescribed.  See med list. -  Refill provided today. - Will continue to monitor.   Anxiety - Stable on current management, Wellbutrin XR. - Patient tolerating meds well without S-E. - Continue treatment plan as prescribed.  See med list. - Refill provided today.  - Reviewed the "spokes of the wheel" of mood and health management.  Stressed the importance of ongoing prudent habits, including regular exercise, appropriate sleep hygiene, healthful dietary habits, and prayer/meditation to relax.  - Will continue to monitor.   COVID Counseling & Preventative Health Maintenance - Advised patient to continue working toward exercising to improve overall mental, physical, and emotional health.    -  Healthy dietary habits encouraged, including low-carb, and high amounts of lean protein in diet.   - Patient should also consume adequate amounts of water.  - Novel Covid -19 counseling done; all questions were answered.   - Current CDC / federal and [place] guidelines reviewed with patient  - Reminded pt of extreme importance of social distancing; wearing a mask when out in public; insensate handwashing and cleaning of surfaces, avoiding unnecessary trips for shopping and avoiding ALL but emergency appts etc. - Told patient to be prepared, not scared; and be smart for the sake of others - Patient will call with any additional concerns  - Health counseling performed.  All questions answered.   Recommendations  - Return in near future for fasting blood work and CPE as she was told in June to return in 3 months for CPE and FBW.  Discussed that patient is overdue for this and should return as advised.  - Discussed that patient may come in early for lab work and later for the physical.  - Additionally, discussion had with patient regarding our treatment plan, and their biases/concerns about that plan were used in my medical decision making today.    - The patient agreed with the plan and demonstrated an understanding of the instructions.   No barriers to understanding were identified.     Return for Follow-up near future for CPE and fasting blood work same day.    Meds ordered this encounter  Medications  . amphetamine-dextroamphetamine (ADDERALL XR) 30 MG 24 hr capsule    Sig: Take 1 capsule (30 mg total) by mouth daily.    Dispense:  90 capsule    Refill:  0  . buPROPion (WELLBUTRIN XL) 150 MG 24 hr tablet    Sig: Take 1 tablet (150 mg total) by mouth every morning.    Dispense:  90 tablet    Refill:  1    Medications Discontinued During This Encounter  Medication Reason  . ADDERALL XR 30 MG 24 hr capsule Completed Course  . amphetamine-dextroamphetamine (ADDERALL XR) 30  MG 24 hr capsule Completed Course  . amphetamine-dextroamphetamine (ADDERALL XR) 30 MG 24 hr capsule Reorder  . buPROPion (WELLBUTRIN XL) 150 MG 24 hr tablet Reorder     I provided 11 minutes of non face-to-face time during this encounter.  Additional time was spent with charting and coordination of care after the actual visit commenced.    Note:  This note was prepared with assistance of Dragon voice recognition software. Occasional wrong-word or sound-a-like substitutions may have occurred due to the inherent limitations of voice recognition software.   This document serves as a record of services personally performed by Thomasene Lot, DO. It was created on her behalf by Peggye Fothergill, a trained medical scribe. The creation of this record is based on the scribe's personal observations and the provider's  statements to them.   I have reviewed the above medical documentation for accuracy and completeness and I concur.  Thomasene Loteborah Jeanclaude Wentworth, DO 12/17/2018 9:01 AM   Patient Care Team    Relationship Specialty Notifications Start End  Thomasene Lotpalski, Grove Defina, DO PCP - General Family Medicine  07/25/17     -Vitals obtained; medications/ allergies reconciled;  personal medical, social, Sx etc.histories were updated by CMA, reviewed by me and are reflected in chart   Patient Active Problem List   Diagnosis Date Noted  . Class 1 obesity in adult 08/03/2018  . Obesity, Class I, BMI 30-34.9 04/06/2018  . likely Metabolic syndrome 09/12/2017  . Gestational diabetes   . Hx gestational diabetes 08/01/2017  . Incompetent cervix 08/01/2017  . Mood disorder (HCC) 08/01/2017  . Attention deficit hyperactivity disorder (ADHD) 08/01/2017  . Class 2 obesity with body mass index (BMI) of 35 to 39.9 without comorbidity 08/01/2017  . Vitamin D deficiency 08/01/2017  . Family history of breast cancer-  paternal aunt in 5340s 08/01/2017  . Family history of early CAD 08/01/2017  . IUD (intrauterine device)  in place 08/01/2017  . Bicornuate uterus affecting pregnancy, antepartum, third trimester 02/21/2014  . Recurrent urinary tract infection affecting pregnancy in third trimester 11/08/2013  . Previous preterm labor affecting pregnancy, antepartum 07/26/2013  . Anxiety 07/07/2012    Current Meds  Medication Sig  . amphetamine-dextroamphetamine (ADDERALL XR) 30 MG 24 hr capsule Take 1 capsule (30 mg total) by mouth daily.  Marland Kitchen. buPROPion (WELLBUTRIN XL) 150 MG 24 hr tablet Take 1 tablet (150 mg total) by mouth every morning.  . Cholecalciferol (VITAMIN D3) 125 MCG (5000 UT) CAPS Take 1 capsule by mouth daily.  Marland Kitchen. levonorgestrel (MIRENA) 20 MCG/24HR IUD 1 each by Intrauterine route once.  . [DISCONTINUED] amphetamine-dextroamphetamine (ADDERALL XR) 30 MG 24 hr capsule Take 1 capsule (30 mg total) by mouth daily for 30 days.  . [DISCONTINUED] buPROPion (WELLBUTRIN XL) 150 MG 24 hr tablet Take 1 tablet (150 mg total) by mouth every morning.    Allergies:  Allergies  Allergen Reactions  . Codeine     Hallucinations/Vomiting    ROS:  See above HPI for pertinent positives and negatives   Objective:   Temperature (!) 97.5 F (36.4 C), weight 181 lb (82.1 kg).  (if some vitals are omitted, this means that patient was UNABLE to obtain them even though they were asked to get them prior to OV today.  They were asked to call us at their earliest convenience with these once obtained. )  General: A & O * 3; sounds in no acute distress; in usual state of health.  Skin: Pt confirms warm and dry extremities and pink fingertips HEENT: Pt confirms lips non-cyanotic Chest: Patient confirms normal chest excursion and movement Respiratory: speaking in full sentences, no conversational dyspnea; patient confirms no use of accessory muscles Psych: insight appears good, mood- appears full

## 2019-02-16 ENCOUNTER — Encounter: Payer: Self-pay | Admitting: Family Medicine

## 2019-02-16 ENCOUNTER — Other Ambulatory Visit: Payer: Self-pay | Admitting: Family Medicine

## 2019-02-16 DIAGNOSIS — F909 Attention-deficit hyperactivity disorder, unspecified type: Secondary | ICD-10-CM

## 2019-02-16 DIAGNOSIS — F419 Anxiety disorder, unspecified: Secondary | ICD-10-CM

## 2019-02-16 NOTE — Telephone Encounter (Signed)
Received 2 refill requests for the same medication.  I do not have authorization to refuse controlled substances.  Charyl Bigger, CMA

## 2019-02-17 ENCOUNTER — Other Ambulatory Visit: Payer: Self-pay | Admitting: Family Medicine

## 2019-02-17 DIAGNOSIS — F909 Attention-deficit hyperactivity disorder, unspecified type: Secondary | ICD-10-CM

## 2019-02-17 DIAGNOSIS — F419 Anxiety disorder, unspecified: Secondary | ICD-10-CM

## 2019-02-17 MED ORDER — AMPHETAMINE-DEXTROAMPHET ER 30 MG PO CP24: 30.0000 mg | ORAL_CAPSULE | Freq: Every day | ORAL | 0 refills | Status: AC

## 2019-02-17 MED ORDER — AMPHETAMINE-DEXTROAMPHET ER 30 MG PO CP24: 30.0000 mg | ORAL_CAPSULE | Freq: Every day | ORAL | 0 refills | Status: DC

## 2019-02-17 NOTE — Progress Notes (Signed)
Because patient was only able to get 30 tablets due to insurance constrictions and not the 90 which I originally wrote for, which was confirmed by the pharmacist, I sent to 30-day supplies.  Patient will need a follow-up for further refills as she was told to follow-up every 90 days for management of this controlled substance.

## 2019-05-03 ENCOUNTER — Telehealth: Payer: Self-pay | Admitting: Obstetrics and Gynecology

## 2019-05-03 ENCOUNTER — Ambulatory Visit: Payer: BC Managed Care – PPO | Attending: Internal Medicine

## 2019-05-03 DIAGNOSIS — Z23 Encounter for immunization: Secondary | ICD-10-CM | POA: Insufficient documentation

## 2019-05-03 DIAGNOSIS — Z30432 Encounter for removal of intrauterine contraceptive device: Secondary | ICD-10-CM

## 2019-05-03 DIAGNOSIS — Z30017 Encounter for initial prescription of implantable subdermal contraceptive: Secondary | ICD-10-CM

## 2019-05-03 NOTE — Telephone Encounter (Signed)
Spoke to pt. Pt states needing to remove Mirena IUD due to expiration on 04/27/2019 for the 5 year mark. Pt states not wanting to do IUD anymore since has been on it for last 10 years in between having children and wanting to try something different. Pt given options for birth control of Nuvaring, Nexplanon, OCPs.  Pt going to research new birth control options and decide on what to do for OV on 05/05/2019 at 4 pm with Dr Oscar La. Pt agreeable. Pt also might consider Nexplanon. Pt aware of call for benefits for IUD removal. Pt aware of precert for Nexplanon  just in case to do at OV. Pt advised to remain abstinent until OV. Pt agreeable.   Routing to Dr Oscar La for review and will close encounter.  CX:KGYJ for precert. Orders placed.

## 2019-05-03 NOTE — Progress Notes (Signed)
   Covid-19 Vaccination Clinic  Name:  Najia Hurlbutt    MRN: [id number] DOB: [birth year]  05/03/2019  Ms. Carrington was observed post Covid-19 immunization for 15 minutes without incident. She was provided with Vaccine Information Sheet and instruction to access the V-Safe system.   Ms. Tuckett was instructed to call 911 with any severe reactions post vaccine: Marland Kitchen Difficulty breathing  . Swelling of face and throat  . A fast heartbeat  . A bad rash all over body  . Dizziness and weakness   Immunizations Administered    Name Date Dose VIS Date Route   Pfizer COVID-19 Vaccine 05/03/2019  8:41 AM 0.3 mL 02/05/2019 Intramuscular   Manufacturer: [employer], [employer]   Lot: [id number]   NDC: [address]-0

## 2019-05-03 NOTE — Telephone Encounter (Signed)
Patient is ready to have her IUD removed and discuss other options.

## 2019-05-03 NOTE — Telephone Encounter (Signed)
Spoke back with pt. Pt agreeable to keep Mirena IUD for now. Pt just wanted to change due to not wanting to get pregnant. Pt given new guidelines for 6 years with Mirena. Pt verbalized understanding.  Pt states will keep appt for 05/05/2019 and is agreeable to use it as AEX. Appt changed. Orders cancelled for IUD removal and Nexplanon insertion. Staff message to [place] for update.   Routing to Dr Oscar La for final review and will close addendum.

## 2019-05-03 NOTE — Addendum Note (Signed)
Addended by: Isabell Jarvis on: 05/03/2019 04:13 PM   Modules accepted: Orders

## 2019-05-03 NOTE — Telephone Encounter (Signed)
Please let the patient know that the mirena has now been approved for 6 years. She had it placed on 04/27/14, so she can keep it another year if desired.  She is due for an annual, see if she wants to switch that appointment to an annual exam.

## 2019-05-05 ENCOUNTER — Ambulatory Visit: Payer: Self-pay | Admitting: Obstetrics and Gynecology

## 2019-05-25 ENCOUNTER — Ambulatory Visit: Payer: BC Managed Care – PPO

## 2019-06-09 ENCOUNTER — Ambulatory Visit: Payer: BC Managed Care – PPO | Attending: Internal Medicine

## 2019-06-09 ENCOUNTER — Ambulatory Visit: Payer: BC Managed Care – PPO

## 2019-06-09 DIAGNOSIS — Z23 Encounter for immunization: Secondary | ICD-10-CM

## 2019-06-09 NOTE — Progress Notes (Signed)
   Covid-19 Vaccination Clinic  Name:  Sumaiya Arruda    MRN: [id number] DOB: 1986/01/29  06/09/2019  Ms. Melendrez was observed post Covid-19 immunization for 15 minutes without incident. She was provided with Vaccine Information Sheet and instruction to access the V-Safe system.   Ms. Roussin was instructed to call 911 with any severe reactions post vaccine: Marland Kitchen Difficulty breathing  . Swelling of face and throat  . A fast heartbeat  . A bad rash all over body  . Dizziness and weakness   Immunizations Administered    Name Date Dose VIS Date Route   Pfizer COVID-19 Vaccine 06/09/2019 10:24 AM 0.3 mL 02/05/2019 Intramuscular   Manufacturer: [employer], [employer]   Lot: [id number]   NDC: [address]-6

## 2019-11-25 ENCOUNTER — Ambulatory Visit (INDEPENDENT_AMBULATORY_CARE_PROVIDER_SITE_OTHER): Payer: BC Managed Care – PPO | Admitting: Podiatry

## 2019-11-25 ENCOUNTER — Other Ambulatory Visit: Payer: Self-pay

## 2019-11-25 ENCOUNTER — Other Ambulatory Visit: Payer: Self-pay | Admitting: Podiatry

## 2019-11-25 ENCOUNTER — Ambulatory Visit (INDEPENDENT_AMBULATORY_CARE_PROVIDER_SITE_OTHER): Payer: BC Managed Care – PPO

## 2019-11-25 DIAGNOSIS — S99922A Unspecified injury of left foot, initial encounter: Secondary | ICD-10-CM | POA: Diagnosis not present

## 2019-11-25 DIAGNOSIS — S92525A Nondisplaced fracture of medial phalanx of left lesser toe(s), initial encounter for closed fracture: Secondary | ICD-10-CM

## 2019-11-25 NOTE — Patient Instructions (Signed)
Toe Fracture A toe fracture is a break in one of the toe bones (phalanges). This may happen if you:  Drop a heavy object on your toe.  Stub your toe.  Twist your toe.  Exercise the same way too much. What are the signs or symptoms? The main symptoms are swelling and pain in the toe. You may also have:  Bruising.  Stiffness.  Numbness.  A change in the way the toe looks.  Broken bones that poke through the skin.  Blood under the toenail. How is this treated? Treatments may include:  Taping the broken toe to a toe that is next to it (buddy taping).  Wearing a shoe that has a wide, rigid sole to protect the toe and to limit its movement.  Wearing a cast.  Surgery. This may be needed if the: ? Pieces of broken bone are out of place. ? Bone pokes through the skin.  Physical therapy. Follow these instructions at home: If you have a shoe:  Wear the shoe as told by your doctor. Remove it only as told by your doctor.  Loosen the shoe if your toes tingle, become numb, or turn cold and blue.  Keep the shoe clean and dry. If you have a cast:  Do not put pressure on any part of the cast until it is fully hardened. This may take a few hours.  Do not stick anything inside the cast to scratch your skin.  Check the skin around the cast every day. Tell your doctor about any concerns.  You may put lotion on dry skin around the edges of the cast.  Do not put lotion on the skin under the cast.  Keep the cast clean and dry. Bathing  Do not take baths, swim, or use a hot tub until your doctor says it is okay. Ask your doctor if you can take showers.  If the shoe or cast is not waterproof: ? Do not let it get wet. ? Cover it with a watertight covering when you take a bath or a shower. Activity  Do not use your foot to support your body weight until your doctor says it is okay.  Use crutches as told by your doctor.  Ask your doctor what activities are safe for you  during recovery.  Avoid activities as told by your doctor.  Do exercises as told by your doctor or therapist. Driving  Do not drive or use heavy machinery while taking pain medicine.  Do not drive while wearing a cast on a foot that you use for driving. Managing pain, stiffness, and swelling   Put ice on the injured area if told by your doctor: ? Put ice in a plastic bag. ? Place a towel between your skin and the bag.  If you have a shoe, remove it as told by your doctor.  If you have a cast, place a towel between your cast and the bag. ? Leave the ice on for 20 minutes, 2-3 times per day.  Raise (elevate) the injured area above the level of your heart while you are sitting or lying down. General instructions  If your toe was taped to a toe that is next to it, follow your doctor's instructions for changing the gauze and tape. Change it more often: ? If the gauze and tape get wet. If this happens, dry the space between the toes. ? If the gauze and tape are too tight and they cause your toe to become pale   or to lose feeling (go numb).  If your doctor did not give you a protective shoe, wear sturdy shoes that support your foot. Your shoes should not: ? Pinch your toes. ? Fit tightly against your toes.  Do not use any tobacco products, including cigarettes, chewing tobacco, or e-cigarettes. These can delay bone healing. If you need help quitting, ask your doctor.  Take medicines only as told by your doctor.  Keep all follow-up visits as told by your doctor. This is important. Contact a doctor if:  Your pain medicine is not helping.  You have a fever.  You notice a bad smell coming from your cast. Get help right away if:  You lose feeling (have numbness) in your toe or foot, and it is getting worse.  Your toe or your foot tingles.  Your toe or your foot gets cold or turns blue.  You have redness or swelling in your toe or foot, and it is getting worse.  You have very  bad pain. Summary  A toe fracture is a break in one of the toe bones.  Use ice and raise your foot. This will help lessen pain and swelling.  Use crutches as told by your doctor. This information is not intended to replace advice given to you by your health care provider. Make sure you discuss any questions you have with your health care provider. Document Revised: 04/17/2017 Document Reviewed: 03/25/2017 Elsevier Patient Education  2020 Elsevier Inc.  

## 2019-11-25 NOTE — Progress Notes (Signed)
Subjective:   Patient ID: Joanna Campos, female   DOB: 34 y.o.   MRN: [id number]   HPI 34 year old female presents the office today for concerns of pain to her left third toe. She states he had the beginning of August she dropped a can on her toe and since then she has had some pain to the toe. She states that she is been having pain since then however about 1 week ago a cart ran over her toe and she had increased pain to the area. She does note some swelling to the toe most of the distal aspect. No open sores. No recent treatment. No other concerns   Review of Systems  All other systems reviewed and are negative.  Past Medical History:  Diagnosis Date  . ADD (attention deficit disorder)   . Anemia   . Bicornate uterus   . Gestational diabetes     Past Surgical History:  Procedure Laterality Date  . BREAST SURGERY    . CHOLECYSTECTOMY       Current Outpatient Medications:  .  amphetamine-dextroamphetamine (ADDERALL XR) 30 MG 24 hr capsule, Take 1 capsule (30 mg total) by mouth daily., Disp: 30 capsule, Rfl: 0 .  buPROPion (WELLBUTRIN XL) 150 MG 24 hr tablet, Take 1 tablet (150 mg total) by mouth every morning., Disp: 90 tablet, Rfl: 1 .  Cholecalciferol (VITAMIN D3) 125 MCG (5000 UT) CAPS, Take 1 capsule by mouth daily., Disp: , Rfl:  .  levonorgestrel (MIRENA) 20 MCG/24HR IUD, 1 each by Intrauterine route once., Disp: , Rfl:   Allergies  Allergen Reactions  . Codeine     Hallucinations/Vomiting         Objective:  Physical Exam  General: AAO x3, NAD  Dermatological: Skin is warm, dry and supple bilateral.  There are no open sores, no preulcerative lesions, no rash or signs of infection present.  Vascular: Dorsalis Pedis artery and Posterior Tibial artery pedal pulses are 2/4 bilateral with immedate capillary fill time. There is no pain with calf compression, swelling, warmth, erythema.   Neruologic: Grossly intact via light touch bilateral.   Musculoskeletal:  Tenderness on the distal aspect of the left third toe with localized edema. No pain in the metatarsals other digits. No other discomfort. Muscular strength 5/5 in all groups tested bilateral.  Gait: Unassisted, Nonantalgic.       Assessment:   Left third toe fracture, middle phalanx     Plan:  -Treatment options discussed including all alternatives, risks, and complications -Etiology of symptoms were discussed -X-rays were obtained and reviewed with the patient. Transverse fracture of the middle phalanx of the left third toe. -Recommend buddy splinting as well as stiffer soled shoe. Anti-inflammatories as needed.  Vivi Barrack DPM

## 2020-04-03 NOTE — Progress Notes (Deleted)
35 y.o. G18P2002 Married White or Caucasian female here for annual exam.      No LMP recorded. (Menstrual status: IUD).          Sexually active: {yes no:314532}  The current method of family planning is {contraception:315051}.    Exercising: {yes no:314532}  {types:19826} Smoker:  {YES NO:22349}  Health Maintenance: Pap:  09-10-17 neg HPV HR neg History of abnormal Pap:  {YES NO:22349} MMG:  none Colonoscopy:  none BMD:   none TDaP:  2015 Gardasil:   *** Covid-19: *** Hep C testing: not done Screening Labs: ***   reports that she has never smoked. She has never used smokeless tobacco. She reports that she does not drink alcohol and does not use drugs.  Past Medical History:  Diagnosis Date  . ADD (attention deficit disorder)   . Anemia   . Bicornate uterus   . Gestational diabetes     Past Surgical History:  Procedure Laterality Date  . BREAST SURGERY    . CHOLECYSTECTOMY      Current Outpatient Medications  Medication Sig Dispense Refill  . amphetamine-dextroamphetamine (ADDERALL XR) 30 MG 24 hr capsule Take 1 capsule (30 mg total) by mouth daily. 30 capsule 0  . buPROPion (WELLBUTRIN XL) 150 MG 24 hr tablet Take 1 tablet (150 mg total) by mouth every morning. 90 tablet 1  . Cholecalciferol (VITAMIN D3) 125 MCG (5000 UT) CAPS Take 1 capsule by mouth daily.    Marland Kitchen levonorgestrel (MIRENA) 20 MCG/24HR IUD 1 each by Intrauterine route once.     No current facility-administered medications for this visit.    Family History  Problem Relation Age of Onset  . Heart disease Father   . Stroke Maternal Grandfather     Review of Systems  Exam:   There were no vitals taken for this visit.     General appearance: alert, cooperative and appears stated age, no acute distress Head: Normocephalic, without obvious abnormality Neck: no adenopathy, thyroid {EXAM; THYROID:18604} Lungs: clear to auscultation bilaterally Breasts: {Exam; breast:13139::"normal appearance, no masses  or tenderness"} Heart: regular rate and rhythm Abdomen: soft, non-tender; no masses,  no organomegaly Extremities: extremities normal, no edema Skin: No rashes or lesions Lymph nodes: Cervical, supraclavicular, and axillary nodes normal. No abnormal inguinal nodes palpated Neurologic: Grossly normal   Pelvic: External genitalia:  no lesions              Urethra:  normal appearing urethra with no masses, tenderness or lesions              Bartholins and Skenes: normal                 Vagina: normal appearing vagina, appropriate for age, normal appearing discharge, no lesions              Cervix: neg cervical motion tenderness, no visible lesions             Bimanual Exam:   Uterus:  {exam; uterus:12215}              Adnexa: {exam; adnexa:12223}                 ***, CMA Chaperone was present for exam.  A:  Well Woman with normal exam  P:   Pap :  Mammogram:  Labs:  Medications:

## 2020-04-04 ENCOUNTER — Ambulatory Visit: Payer: BC Managed Care – PPO | Admitting: Nurse Practitioner

## 2020-04-17 ENCOUNTER — Encounter: Payer: Self-pay | Admitting: Nurse Practitioner

## 2020-04-17 ENCOUNTER — Ambulatory Visit (INDEPENDENT_AMBULATORY_CARE_PROVIDER_SITE_OTHER): Payer: BC Managed Care – PPO | Admitting: Nurse Practitioner

## 2020-04-17 ENCOUNTER — Other Ambulatory Visit: Payer: Self-pay

## 2020-04-17 VITALS — BP 108/70 | HR 80 | Resp 16 | Ht 62.5 in | Wt 208.0 lb

## 2020-04-17 DIAGNOSIS — Z01419 Encounter for gynecological examination (general) (routine) without abnormal findings: Secondary | ICD-10-CM | POA: Diagnosis not present

## 2020-04-17 DIAGNOSIS — Z30431 Encounter for routine checking of intrauterine contraceptive device: Secondary | ICD-10-CM

## 2020-04-17 DIAGNOSIS — R922 Inconclusive mammogram: Secondary | ICD-10-CM

## 2020-04-17 NOTE — Patient Instructions (Signed)
The Food and Drug Administration (FDA) has expanded the approval of Mirena (levonorgestrel-releasing intrauterine system) for up to 7 years of pregnancy prevention. Previously, the intrauterine device was indicated for contraception for up to 6 years. Oct 08, 2019   Health Maintenance, Female Adopting a healthy lifestyle and getting preventive care are important in promoting health and wellness. Ask your health care provider about:  The right schedule for you to have regular tests and exams.  Things you can do on your own to prevent diseases and keep yourself healthy. What should I know about diet, weight, and exercise? Eat a healthy diet  Eat a diet that includes plenty of vegetables, fruits, low-fat dairy products, and lean protein.  Do not eat a lot of foods that are high in solid fats, added sugars, or sodium.   Maintain a healthy weight Body mass index (BMI) is used to identify weight problems. It estimates body fat based on height and weight. Your health care provider can help determine your BMI and help you achieve or maintain a healthy weight. Get regular exercise Get regular exercise. This is one of the most important things you can do for your health. Most adults should:  Exercise for at least 150 minutes each week. The exercise should increase your heart rate and make you sweat (moderate-intensity exercise).  Do strengthening exercises at least twice a week. This is in addition to the moderate-intensity exercise.  Spend less time sitting. Even light physical activity can be beneficial. Watch cholesterol and blood lipids Have your blood tested for lipids and cholesterol at 36 years of age, then have this test every 5 years. Have your cholesterol levels checked more often if:  Your lipid or cholesterol levels are high.  You are older than 36 years of age.  You are at high risk for heart disease. What should I know about cancer screening? Depending on your health history  and family history, you may need to have cancer screening at various ages. This may include screening for:  Breast cancer.  Cervical cancer.  Colorectal cancer.  Skin cancer.  Lung cancer. What should I know about heart disease, diabetes, and high blood pressure? Blood pressure and heart disease  High blood pressure causes heart disease and increases the risk of stroke. This is more likely to develop in people who have high blood pressure readings, are of African descent, or are overweight.  Have your blood pressure checked: ? Every 3-5 years if you are 36-36 years of age. ? Every year if you are 50 years old or older. Diabetes Have regular diabetes screenings. This checks your fasting blood sugar level. Have the screening done:  Once every three years after age 78 if you are at a normal weight and have a low risk for diabetes.  More often and at a younger age if you are overweight or have a high risk for diabetes. What should I know about preventing infection? Hepatitis B If you have a higher risk for hepatitis B, you should be screened for this virus. Talk with your health care provider to find out if you are at risk for hepatitis B infection. Hepatitis C Testing is recommended for:  Everyone born from [birth year] through 1966.  Anyone with known risk factors for hepatitis C. Sexually transmitted infections (STIs)  Get screened for STIs, including gonorrhea and chlamydia, if: ? You are sexually active and are younger than 36 years of age. ? You are older than 36 years of age and your  health care provider tells you that you are at risk for this type of infection. ? Your sexual activity has changed since you were last screened, and you are at increased risk for chlamydia or gonorrhea. Ask your health care provider if you are at risk.  Ask your health care provider about whether you are at high risk for HIV. Your health care provider may recommend a prescription medicine to help  prevent HIV infection. If you choose to take medicine to prevent HIV, you should first get tested for HIV. You should then be tested every 3 months for as long as you are taking the medicine. Pregnancy  If you are about to stop having your period (premenopausal) and you may become pregnant, seek counseling before you get pregnant.  Take 400 to 800 micrograms (mcg) of folic acid every day if you become pregnant.  Ask for birth control (contraception) if you want to prevent pregnancy. Osteoporosis and menopause Osteoporosis is a disease in which the bones lose minerals and strength with aging. This can result in bone fractures. If you are 64 years old or older, or if you are at risk for osteoporosis and fractures, ask your health care provider if you should:  Be screened for bone loss.  Take a calcium or vitamin D supplement to lower your risk of fractures.  Be given hormone replacement therapy (HRT) to treat symptoms of menopause. Follow these instructions at home: Lifestyle  Do not use any products that contain nicotine or tobacco, such as cigarettes, e-cigarettes, and chewing tobacco. If you need help quitting, ask your health care provider.  Do not use street drugs.  Do not share needles.  Ask your health care provider for help if you need support or information about quitting drugs. Alcohol use  Do not drink alcohol if: ? Your health care provider tells you not to drink. ? You are pregnant, may be pregnant, or are planning to become pregnant.  If you drink alcohol: ? Limit how much you use to 0-1 drink a day. ? Limit intake if you are breastfeeding.  Be aware of how much alcohol is in your drink. In the [place] one drink equals one 12 oz bottle of beer (355 mL), one 5 oz glass of wine (148 mL), or one 1 oz glass of hard liquor (44 mL). General instructions  Schedule regular health, dental, and eye exams.  Stay current with your vaccines.  Tell your health care provider  if: ? You often feel depressed. ? You have ever been abused or do not feel safe at home. Summary  Adopting a healthy lifestyle and getting preventive care are important in promoting health and wellness.  Follow your health care provider's instructions about healthy diet, exercising, and getting tested or screened for diseases.  Follow your health care provider's instructions on monitoring your cholesterol and blood pressure. This information is not intended to replace advice given to you by your health care provider. Make sure you discuss any questions you have with your health care provider. Document Revised: 02/04/2018 Document Reviewed: 02/04/2018 Elsevier Patient Education  2021 [employer].

## 2020-04-17 NOTE — Progress Notes (Signed)
35 y.o. G99P2002 Married White or Caucasian female here for annual exam.    Is happy with Mirena IUD, it has been very effective with her very heavy periods. She will most likely want another one when it is time to remove and replace  No LMP recorded. (Menstrual status: IUD).          Sexually active: Yes.    The current method of family planning is IUD Mirena  Inserted 2016 Exercising: Yes.    walking Smoker:  no  Health Maintenance: Pap:  09-10-2017 neg HPV HR neg History of abnormal Pap:  no MMG:  none Colonoscopy:  none BMD:   none TDaP:  2015 Gardasil:   n/a Covid-19: pfizer Hep C testing: not done Screening Labs: n/a   reports that she has never smoked. She has never used smokeless tobacco. She reports that she does not drink alcohol and does not use drugs.  Past Medical History:  Diagnosis Date  . ADD (attention deficit disorder)   . Anemia   . Bicornate uterus   . Gestational diabetes     Past Surgical History:  Procedure Laterality Date  . BREAST SURGERY    . CHOLECYSTECTOMY      Current Outpatient Medications  Medication Sig Dispense Refill  . amphetamine-dextroamphetamine (ADDERALL XR) 30 MG 24 hr capsule Take 1 capsule (30 mg total) by mouth daily. 30 capsule 0  . buPROPion (WELLBUTRIN XL) 150 MG 24 hr tablet Take 1 tablet (150 mg total) by mouth every morning. 90 tablet 1  . Cholecalciferol (VITAMIN D) 50 MCG (2000 UT) CAPS     . levonorgestrel (MIRENA) 20 MCG/24HR IUD 1 each by Intrauterine route once.     No current facility-administered medications for this visit.    Family History  Problem Relation Age of Onset  . Heart disease Father   . Stroke Maternal Grandfather     Review of Systems  Constitutional: Negative.   HENT: Negative.   Eyes: Negative.   Respiratory: Negative.   Cardiovascular: Negative.   Gastrointestinal: Negative.   Endocrine: Negative.   Genitourinary: Negative.   Musculoskeletal: Negative.   Skin: Negative.    Allergic/Immunologic: Negative.   Neurological: Negative.   Hematological: Negative.   Psychiatric/Behavioral: Negative.     Exam:   BP 108/70   Pulse 80   Resp 16   Ht 5' 2.5" (1.588 m)   Wt 208 lb (94.3 kg)   BMI 37.44 kg/m   Height: 5' 2.5" (158.8 cm)  General appearance: alert, cooperative and appears stated age, no acute distress Head: Normocephalic, without obvious abnormality Neck: no adenopathy, thyroid normal to inspection and palpation Lungs: clear to auscultation bilaterally Breasts: No axillary or supraclavicular adenopathy, Breast reduction surgery, thick tender tissue right breast located 9 o'clock area Heart: regular rate and rhythm Abdomen: soft, non-tender; no masses,  no organomegaly Extremities: extremities normal, no edema Skin: No rashes or lesions Lymph nodes: Cervical, supraclavicular, and axillary nodes normal. No abnormal inguinal nodes palpated Neurologic: Grossly normal   Pelvic: External genitalia:  no lesions              Urethra:  normal appearing urethra with no masses, tenderness or lesions              Bartholins and Skenes: normal                 Vagina: normal appearing vagina, appropriate for age, normal appearing discharge, no lesions  Cervix: neg cervical motion tenderness, no visible lesions             Bimanual Exam:   Uterus:  normal size, contour, position, consistency, mobility, non-tender              Adnexa: no mass, fullness, tenderness                Joy, CMA Chaperone was present for exam.  A:  Well Woman with normal exam  Breast density, Hx breast reduction  Mirena IUD surveillance  P:   Pap :due 2024  Mammogram: will schedule now, Right breast US  Labs:n/a  Medications: no new  Mirena IUD in date until 04/2021 (inserted 04/2014)

## 2020-04-18 ENCOUNTER — Telehealth: Payer: Self-pay | Admitting: *Deleted

## 2020-04-18 DIAGNOSIS — R922 Inconclusive mammogram: Secondary | ICD-10-CM

## 2020-04-18 NOTE — Telephone Encounter (Signed)
-----   Message from Clarita Crane, NP sent at 04/17/2020  4:34 PM EST ----- Please schedule Bilateral mammogram and Right breast US. Pt has had breast reduction surgery, and maybe what I feel is scar tissue but there is a firm, tender grouping of tissue in right breast in the area of 9 o'clock.

## 2020-04-18 NOTE — Telephone Encounter (Signed)
Patient scheduled at the breast center on 05/31/20 @ 10:40am. Patient informed with time and date, number provided to call for any cancellations.

## 2020-05-26 ENCOUNTER — Ambulatory Visit
Admission: RE | Admit: 2020-05-26 | Discharge: 2020-05-26 | Disposition: A | Discharge status: Home / Self Care | Payer: BC Managed Care – PPO | Source: Ambulatory Visit | Attending: Nurse Practitioner | Admitting: Nurse Practitioner

## 2020-05-26 ENCOUNTER — Other Ambulatory Visit: Payer: Self-pay

## 2020-05-26 DIAGNOSIS — R922 Inconclusive mammogram: Secondary | ICD-10-CM

## 2020-05-31 ENCOUNTER — Other Ambulatory Visit: Payer: BC Managed Care – PPO

## 2021-05-14 ENCOUNTER — Encounter: Payer: Self-pay | Admitting: Physician Assistant

## 2021-05-17 ENCOUNTER — Telehealth: Payer: Self-pay | Admitting: *Deleted

## 2021-05-17 DIAGNOSIS — Z30431 Encounter for routine checking of intrauterine contraceptive device: Secondary | ICD-10-CM

## 2021-05-17 NOTE — Telephone Encounter (Signed)
Patient informed with below note she would like to have IUD exchange early.  ? ?Encounter routed to [facility] to check benefits and appointments will reach out to patient to schedule. ?

## 2021-05-17 NOTE — Telephone Encounter (Signed)
We can certainly exchange her IUD early if her cycles are starting to get heavy again. She can schedule that appointment if desired.  ?

## 2021-05-17 NOTE — Telephone Encounter (Signed)
Patient called has annual exam scheduled on 07/26/21. Called today this would be her 2nd IUD due for removal this year, patient reports having light cycles with this IUD. However last night she had heavy bleeding with clots, had ruined 2 pair of panties last night due to bleeding. This am bleeding is not heavy, flow appears to be medium for now, still has clots when wiping. Patient wanted to let you know this and said she will monitor the next couple of days and if bleeding become heavy about she will reach out.   ?

## 2021-05-23 NOTE — Telephone Encounter (Signed)
Iud removal and insertion scheduled on 07/30/21 ?

## 2021-05-31 NOTE — Telephone Encounter (Signed)
Prior Authorization not required for contraception. ? ?Encounter previously closed. ?

## 2021-06-14 NOTE — Addendum Note (Signed)
Addended by: Aura Camps on: 06/14/2021 12:07 PM ? ? Modules accepted: Orders ? ?

## 2021-07-02 ENCOUNTER — Other Ambulatory Visit: Payer: Self-pay | Admitting: Family Medicine

## 2021-07-02 DIAGNOSIS — R748 Abnormal levels of other serum enzymes: Secondary | ICD-10-CM

## 2021-07-11 ENCOUNTER — Ambulatory Visit
Admission: RE | Admit: 2021-07-11 | Discharge: 2021-07-11 | Disposition: A | Discharge status: Home / Self Care | Payer: BC Managed Care – PPO | Source: Ambulatory Visit | Attending: Family Medicine | Admitting: Family Medicine

## 2021-07-11 DIAGNOSIS — R748 Abnormal levels of other serum enzymes: Secondary | ICD-10-CM

## 2021-07-17 NOTE — Progress Notes (Deleted)
36 y.o. G88P2002 Married White or Caucasian Not Hispanic or Latino female here for annual exam.      No LMP recorded. (Menstrual status: IUD).          Sexually active: {yes no:314532}  The current method of family planning is {contraception:315051}.    Exercising: {yes no:314532}  {types:19826} Smoker:  {YES [id number]  Health Maintenance: Pap:   09-10-2017 neg HPV HR neg History of abnormal Pap:  no MMG:  05/26/20 diag Bi-rads 1 neg BMD:   n/a Colonoscopy: n/a TDaP:  01/31/14  Gardasil: n/a   reports that she has never smoked. She has never used smokeless tobacco. She reports that she does not drink alcohol and does not use drugs.  Past Medical History:  Diagnosis Date   ADD (attention deficit disorder)    Anemia    Bicornate uterus    Gestational diabetes     Past Surgical History:  Procedure Laterality Date   BREAST SURGERY     CHOLECYSTECTOMY      Current Outpatient Medications  Medication Sig Dispense Refill   amphetamine-dextroamphetamine (ADDERALL XR) 30 MG 24 hr capsule Take 1 capsule (30 mg total) by mouth daily. 30 capsule 0   buPROPion (WELLBUTRIN XL) 150 MG 24 hr tablet Take 1 tablet (150 mg total) by mouth every morning. 90 tablet 1   Cholecalciferol (VITAMIN D) 50 MCG (2000 UT) CAPS      levonorgestrel (MIRENA) 20 MCG/24HR IUD 1 each by Intrauterine route once.     No current facility-administered medications for this visit.    Family History  Problem Relation Age of Onset   Heart disease Father    Stroke Maternal Grandfather     Review of Systems  Exam:   There were no vitals taken for this visit.  Weight change: @WEIGHTCHANGE @ Height:      Ht Readings from Last 3 Encounters:  04/17/20 5' 2.5" (1.588 m)  08/03/18 5\' 3"  (1.6 m)  04/06/18 5\' 3"  (1.6 m)    General appearance: alert, cooperative and appears stated age Head: Normocephalic, without obvious abnormality, atraumatic Neck: no adenopathy, supple, symmetrical, trachea midline and thyroid  {CHL AMB PHY EX THYROID NORM [phone]::"normal to inspection and palpation"} Lungs: clear to auscultation bilaterally Cardiovascular: regular rate and rhythm Breasts: {Exam; breast:13139::"normal appearance, no masses or tenderness"} Abdomen: soft, non-tender; non distended,  no masses,  no organomegaly Extremities: extremities normal, atraumatic, no cyanosis or edema Skin: Skin color, texture, turgor normal. No rashes or lesions Lymph nodes: Cervical, supraclavicular, and axillary nodes normal. No abnormal inguinal nodes palpated Neurologic: Grossly normal   Pelvic: External genitalia:  no lesions              Urethra:  normal appearing urethra with no masses, tenderness or lesions              Bartholins and Skenes: normal                 Vagina: normal appearing vagina with normal color and discharge, no lesions              Cervix: {CHL AMB PHY EX CERVIX NORM DEFAULT:[phone]::"no lesions"}               Bimanual Exam:  Uterus:  {CHL AMB PHY EX UTERUS NORM DEFAULT:[phone]::"normal size, contour, position, consistency, mobility, non-tender"}              Adnexa: {CHL AMB PHY EX ADNEXA NO MASS DEFAULT:[phone]::"no mass, fullness, tenderness"}  Rectovaginal: Confirms               Anus:  normal sphincter tone, no lesions  *** chaperoned for the exam.  A:  Well Woman with normal exam  P:

## 2021-07-24 ENCOUNTER — Other Ambulatory Visit: Payer: Self-pay | Admitting: Family Medicine

## 2021-07-24 DIAGNOSIS — F909 Attention-deficit hyperactivity disorder, unspecified type: Secondary | ICD-10-CM

## 2021-07-24 DIAGNOSIS — F419 Anxiety disorder, unspecified: Secondary | ICD-10-CM

## 2021-07-26 ENCOUNTER — Ambulatory Visit: Payer: BC Managed Care – PPO | Admitting: Obstetrics and Gynecology

## 2021-07-30 ENCOUNTER — Encounter: Payer: Self-pay | Admitting: Obstetrics and Gynecology

## 2021-07-30 ENCOUNTER — Ambulatory Visit: Payer: BC Managed Care – PPO | Admitting: Obstetrics and Gynecology

## 2021-07-30 ENCOUNTER — Ambulatory Visit (INDEPENDENT_AMBULATORY_CARE_PROVIDER_SITE_OTHER): Payer: BC Managed Care – PPO | Admitting: Obstetrics and Gynecology

## 2021-07-30 VITALS — BP 110/68 | HR 67 | Ht 63.0 in | Wt 207.0 lb

## 2021-07-30 DIAGNOSIS — Z01419 Encounter for gynecological examination (general) (routine) without abnormal findings: Secondary | ICD-10-CM

## 2021-07-30 DIAGNOSIS — Z8632 Personal history of gestational diabetes: Secondary | ICD-10-CM

## 2021-07-30 DIAGNOSIS — Z30431 Encounter for routine checking of intrauterine contraceptive device: Secondary | ICD-10-CM | POA: Diagnosis not present

## 2021-07-30 NOTE — Progress Notes (Signed)
36 y.o. G2P2002 Married White or Caucasian Not Hispanic or Latino female here for annual exam.  She has a mirena IUD, inserted in March, 2016. She has a h/o heavy, painful cycles and endometriosis. The mirena IUD stopped her cycles and gave her birth control. Now she is having a period every other month randomly and its heavy and cramping. Bleeding for 5-6 days.  No dyspareunia.    She reports elevated LFT's with her primary, fatty liver on U/S last month. She reports the rest of her lab work was normal.   No LMP recorded. (Menstrual status: IUD).          Sexually active: Yes.    The current method of family planning is IUD. Inserted in 3, 2016  Exercising: Yes.    The patient has a physically strenuous job, but has no regular exercise apart from work.  Smoker:  no  Health Maintenance: Pap:  09-10-2017 neg HPV HR neg History of abnormal Pap:  no MMG:  05/26/20 DIAG right breast normal  BMD:   n/a Colonoscopy: none  TDaP:  2015  Gardasil: none, discussed.     reports that she has never smoked. She has never used smokeless tobacco. She reports that she does not drink alcohol and does not use drugs. 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 Kids are 98 (boy) and 7 (girl).   Past Medical History:  Diagnosis Date   ADD (attention deficit disorder)    Anemia    Gestational diabetes     Past Surgical History:  Procedure Laterality Date   BREAST SURGERY     CHOLECYSTECTOMY      Current Outpatient Medications  Medication Sig Dispense Refill   Cholecalciferol (VITAMIN D) 50 MCG (2000 UT) CAPS      FLUoxetine (PROZAC) 40 MG capsule Take 40 mg by mouth daily.     levonorgestrel (MIRENA) 20 MCG/24HR IUD 1 each by Intrauterine route once.     amphetamine-dextroamphetamine (ADDERALL XR) 30 MG 24 hr capsule Take 1 capsule (30 mg total) by mouth daily. 30 capsule 0   No current facility-administered medications for this visit.    Family History  Problem Relation Age of Onset   Heart  disease Father    Stroke Maternal Grandfather     Review of Systems  Genitourinary:  Positive for menstrual problem and vaginal bleeding.  All other systems reviewed and are negative.  Exam:   BP 110/68   Pulse 67   Ht 5\' 3"  (1.6 m)   Wt 207 lb (93.9 kg)   SpO2 99%   BMI 36.67 kg/m   Weight change: @WEIGHTCHANGE @ Height:   Height: 5\' 3"  (160 cm)  Ht Readings from Last 3 Encounters:  07/30/21 5\' 3"  (1.6 m)  04/17/20 5' 2.5" (1.588 m)  08/03/18 5\' 3"  (1.6 m)    General appearance: alert, cooperative and appears stated age Head: Normocephalic, without obvious abnormality, atraumatic Neck: no adenopathy, supple, symmetrical, trachea midline and thyroid normal to inspection and palpation Lungs: clear to auscultation bilaterally Cardiovascular: regular rate and rhythm Breasts: normal appearance, no masses or tenderness Abdomen: soft, non-tender; non distended,  no masses,  no organomegaly Extremities: extremities normal, atraumatic, no cyanosis or edema Skin: Skin color, texture, turgor normal. No rashes or lesions Lymph nodes: Cervical, supraclavicular, and axillary nodes normal. No abnormal inguinal nodes palpated Neurologic: Grossly normal   Pelvic: External genitalia:  no lesions              Urethra:  normal appearing urethra with no masses, tenderness or lesions              Bartholins and Skenes: normal                 Vagina: normal appearing vagina with normal color and discharge, no lesions              Cervix: no lesions, IUD string 3 cm               Bimanual Exam:  Uterus:  normal size, contour, position, consistency, mobility, non-tender and retroverted              Adnexa: no mass, fullness, tenderness               Rectovaginal: Confirms               Anus:  normal sphincter tone, no lesions  Gae Dry chaperoned for the exam.  1. Well woman exam Discussed breast self exam Discussed calcium and vit D intake Pap due next year Labs with primary  2.  Surveillance of intrauterine contraceptive device She is scheduled for removal and reinsertion of mirena IUD later this week.  3. Hx gestational diabetes Having lab work with her primary

## 2021-07-30 NOTE — Patient Instructions (Signed)

## 2021-08-02 ENCOUNTER — Encounter: Payer: Self-pay | Admitting: Obstetrics and Gynecology

## 2021-08-02 ENCOUNTER — Ambulatory Visit: Payer: BC Managed Care – PPO | Admitting: Obstetrics and Gynecology

## 2021-08-02 VITALS — BP 110/62 | HR 72 | Wt 206.0 lb

## 2021-08-02 DIAGNOSIS — Z30433 Encounter for removal and reinsertion of intrauterine contraceptive device: Secondary | ICD-10-CM | POA: Diagnosis not present

## 2021-08-02 NOTE — Patient Instructions (Signed)

## 2021-08-02 NOTE — Progress Notes (Signed)
GYNECOLOGY  VISIT   HPI: 36 y.o.   Married White or Caucasian Not Hispanic or Latino  female   [phone] with No LMP recorded. (Menstrual status: IUD).   here for here for IUD removal and reinsertion of a mirena IUD    GYNECOLOGIC HISTORY: No LMP recorded. (Menstrual status: IUD). Contraception:IUD  Menopausal hormone therapy: none         OB History     Gravida  2   Para  2   Term  2   Preterm  0   AB  0   Living  2      SAB  0   IAB  0   Ectopic  0   Multiple  0   Live Births  2              Patient Active Problem List   Diagnosis Date Noted   Class 1 obesity in adult 08/03/2018   Obesity, Class I, BMI 30-34.9 04/06/2018   likely Metabolic syndrome 09/12/2017   Gestational diabetes    Hx gestational diabetes 08/01/2017   Incompetent cervix 08/01/2017   Mood disorder (HCC) 08/01/2017   Attention deficit hyperactivity disorder (ADHD) 08/01/2017   Class 2 obesity with body mass index (BMI) of 35 to 39.9 without comorbidity 08/01/2017   Vitamin D deficiency 08/01/2017   Family history of breast cancer-  paternal aunt in 28s 08/01/2017   Family history of early CAD 08/01/2017   IUD (intrauterine device) in place 08/01/2017   Bicornuate uterus affecting pregnancy, antepartum, third trimester 02/21/2014   Recurrent urinary tract infection affecting pregnancy in third trimester 11/08/2013   Previous preterm labor affecting pregnancy, antepartum 07/26/2013   Anxiety 07/07/2012    Past Medical History:  Diagnosis Date   ADD (attention deficit disorder)    Anemia    Gestational diabetes     Past Surgical History:  Procedure Laterality Date   BREAST SURGERY     CHOLECYSTECTOMY      Current Outpatient Medications  Medication Sig Dispense Refill   amphetamine-dextroamphetamine (ADDERALL XR) 30 MG 24 hr capsule Take 1 capsule (30 mg total) by mouth daily. 30 capsule 0   Cholecalciferol (VITAMIN D) 50 MCG (2000 UT) CAPS      FLUoxetine (PROZAC) 40 MG  capsule Take 40 mg by mouth daily.     levonorgestrel (MIRENA) 20 MCG/24HR IUD 1 each by Intrauterine route once.     No current facility-administered medications for this visit.     ALLERGIES: Codeine  Family History  Problem Relation Age of Onset   Heart disease Father    Stroke Maternal Grandfather     Social History   Socioeconomic History   Marital status: Married    Spouse name: Not on file   Number of children: Not on file   Years of education: Not on file   Highest education level: Not on file  Occupational History   Not on file  Tobacco Use   Smoking status: Never   Smokeless tobacco: Never  Vaping Use   Vaping Use: Never used  Substance and Sexual Activity   Alcohol use: No   Drug use: No   Sexual activity: Yes    Birth control/protection: I.U.D.    Comment: Mirena placed in 2016  Other Topics Concern   Not on file  Social History Narrative   Not on file   Social Determinants of Health   Financial Resource Strain: Not on file  Food Insecurity: Not on file  Transportation Needs: Not on file  Physical Activity: Not on file  Stress: Not on file  Social Connections: Not on file  Intimate Partner Violence: Not on file    Review of Systems  All other systems reviewed and are negative.   PHYSICAL EXAMINATION:    BP 110/62   Pulse 72   Wt 206 lb (93.4 kg)   SpO2 99%   BMI 36.49 kg/m     General appearance: alert, cooperative and appears stated age  Pelvic: External genitalia:  no lesions              Urethra:  normal appearing urethra with no masses, tenderness or lesions              Bartholins and Skenes: normal                 Vagina: normal appearing vagina with normal color and discharge, no lesions              Cervix: no lesions and IUD strings 2 cm               The risks of the mirena IUD were reviewed with the patient, including infection, abnormal bleeding and uterine perfortion. Consent was signed.  A speculum was placed in the  vagina, the IUD was removed with ringed forceps. The cervix was cleansed with betadine. A tenaculum was placed on the cervix, the uterus sounded to ~8 cm. The cervix was dilated to a 5 hagar dilator, dilation was slightly difficult  The mirena IUD was inserted without difficulty. The string were cut to 3 cm.    The patient tolerated the procedure well.   POST IUD INSERTION a brief ultrasound was done that showed perfect IUD location  Chaperone was present for exam.  1. Encounter for IUD removal and reinsertion - IUD removal - IUD Insertion, mirena -F/U in one month

## 2023-05-05 IMAGING — US US ABDOMEN COMPLETE
1 series · 14 of 25 positions shown · non-contrast
Comparison: None Available.

CLINICAL DATA: Elevated liver enzymes

EXAM:
ABDOMEN ULTRASOUND COMPLETE

[Series 1: us abdomen complete · 0.20mm/px · 14 of 101 slices shown]
[im 1/101]
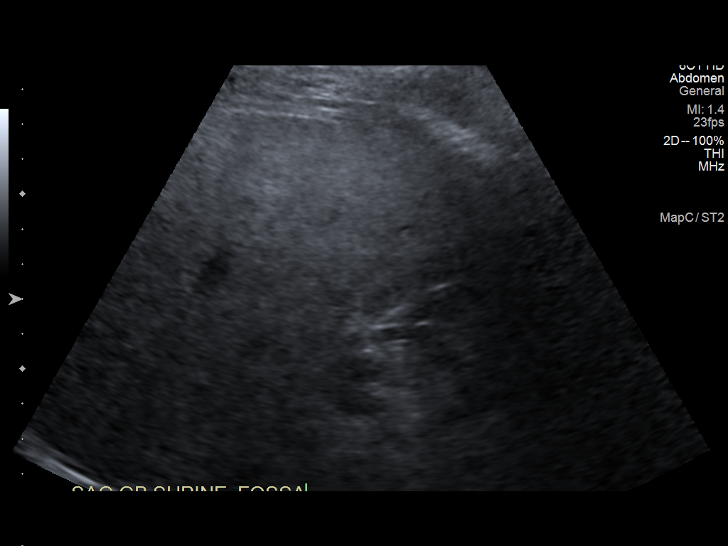
[im 9/101]
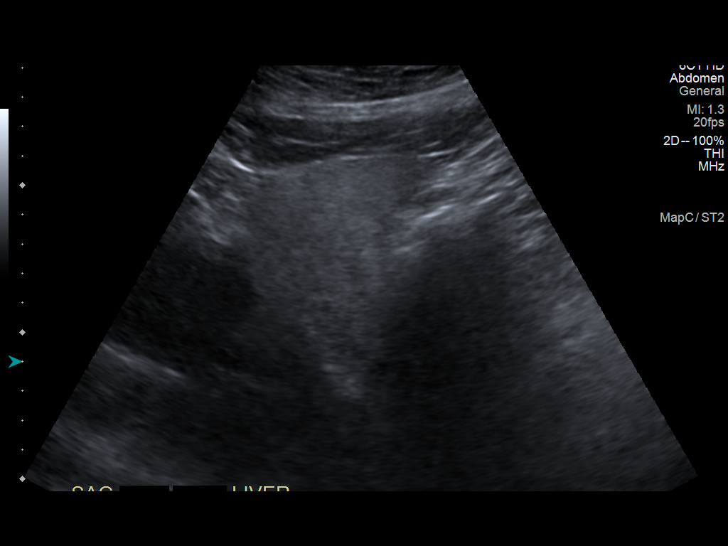
[im 17/101]
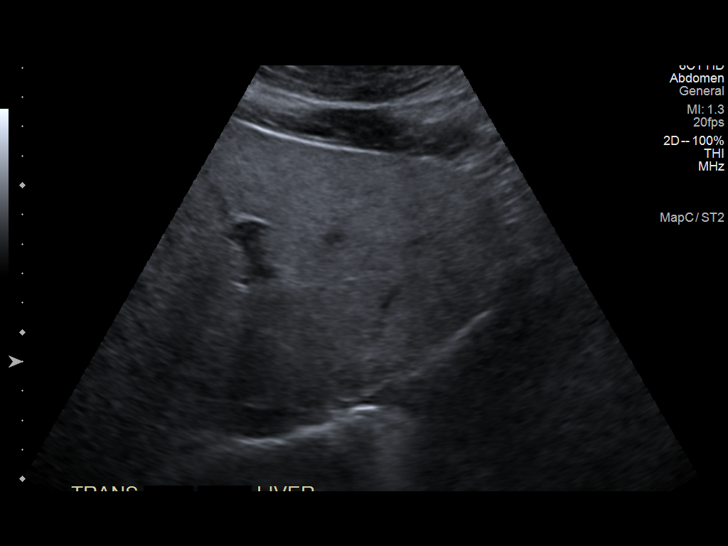
[im 26/101]
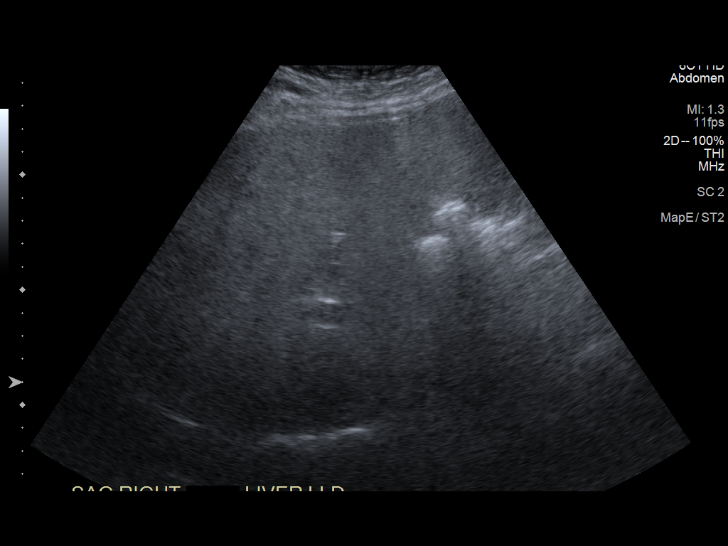
[im 34/101]
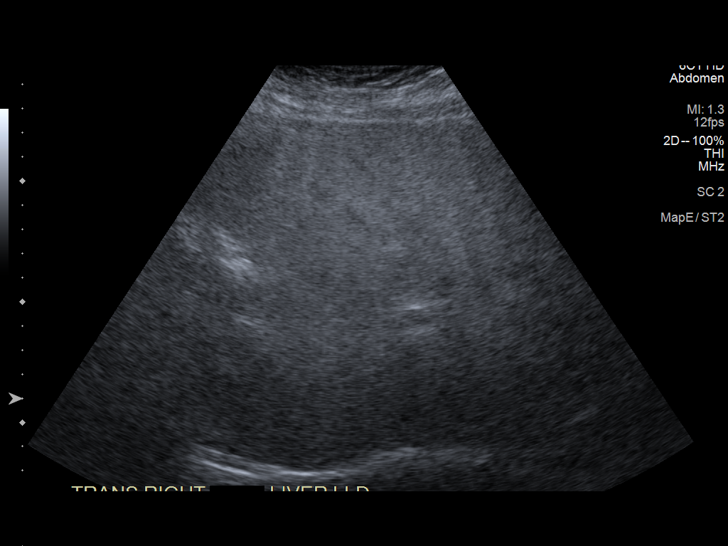
[im 38/101]
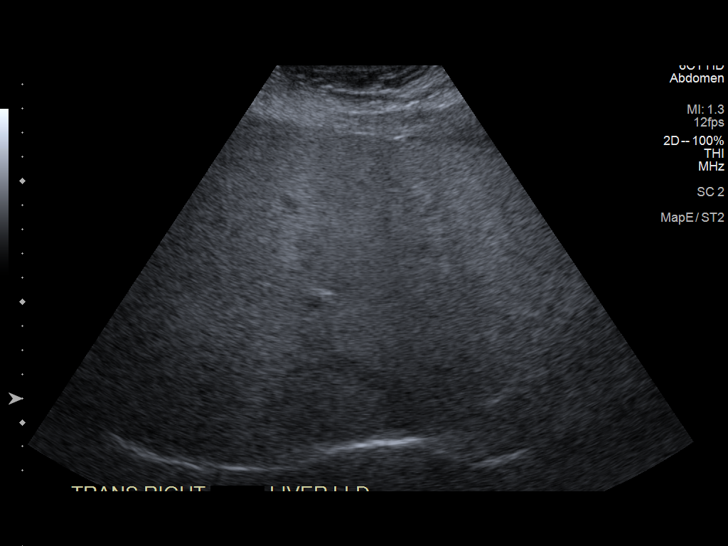
[im 46/101]
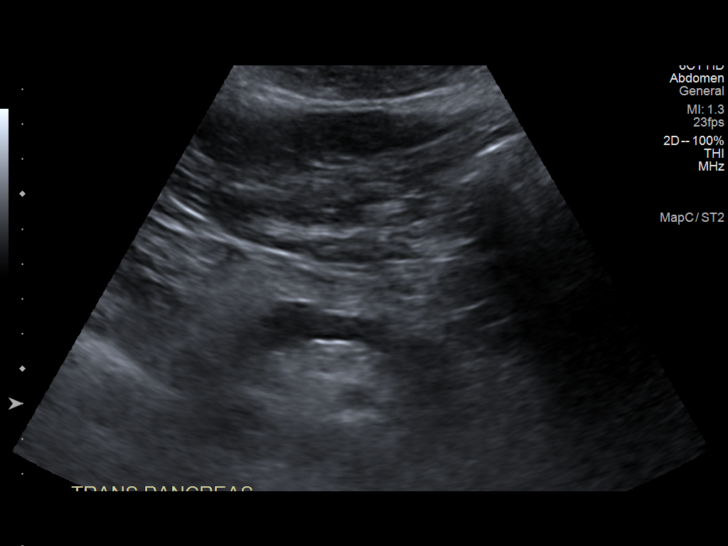
[im 55/101]
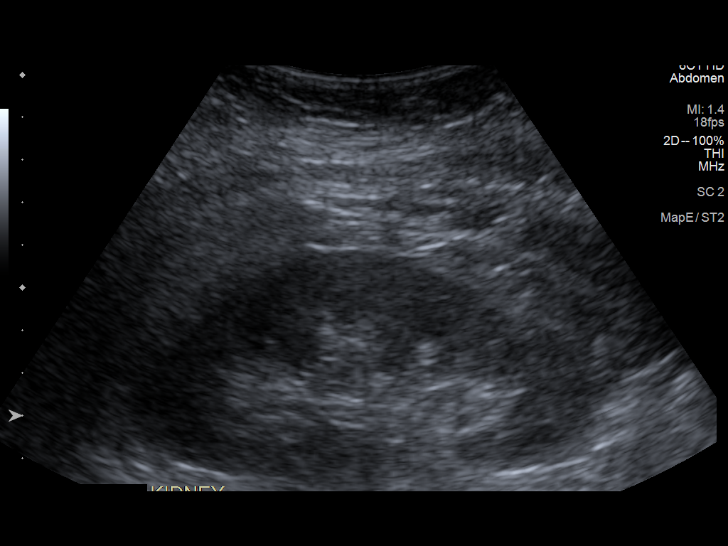
[im 63/101]
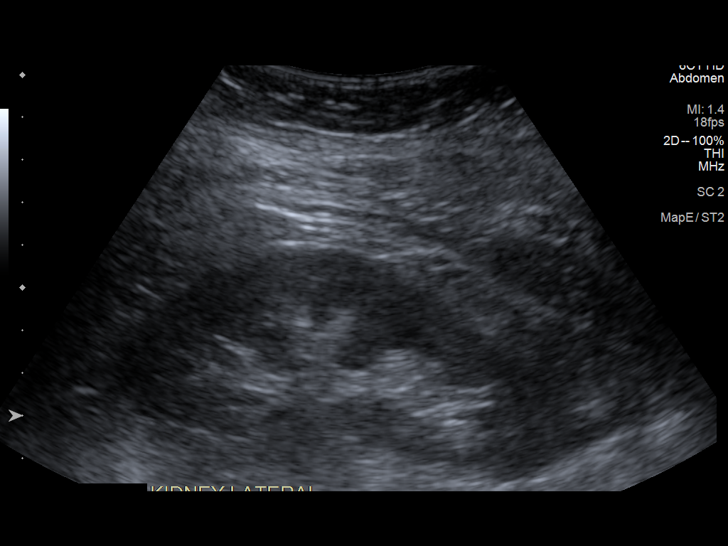
[im 67/101]
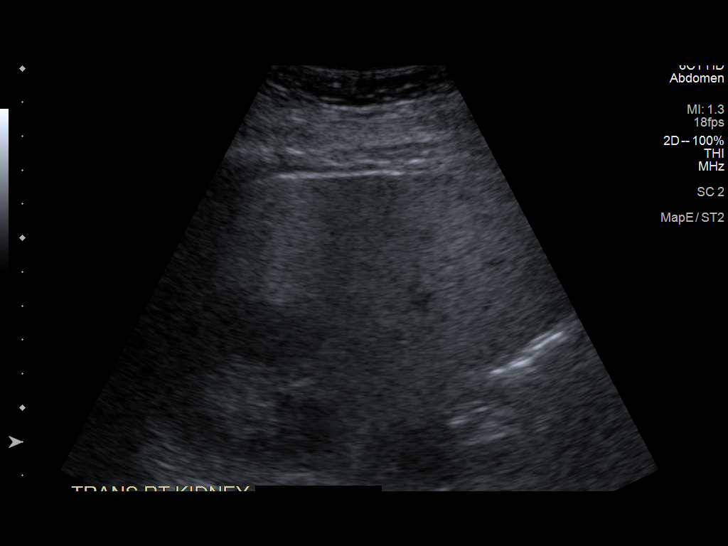
[im 76/101]
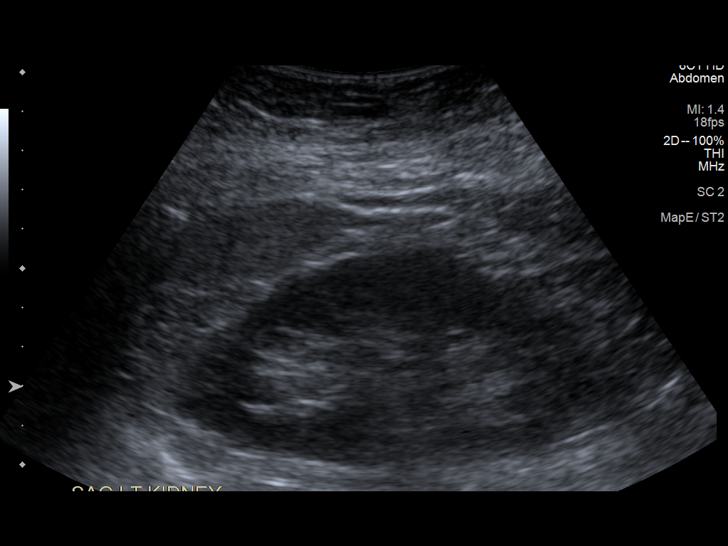
[im 84/101]
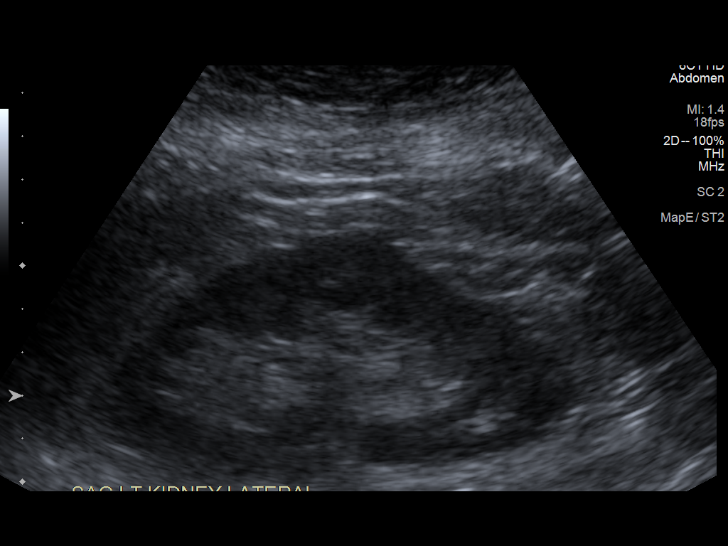
[im 92/101]
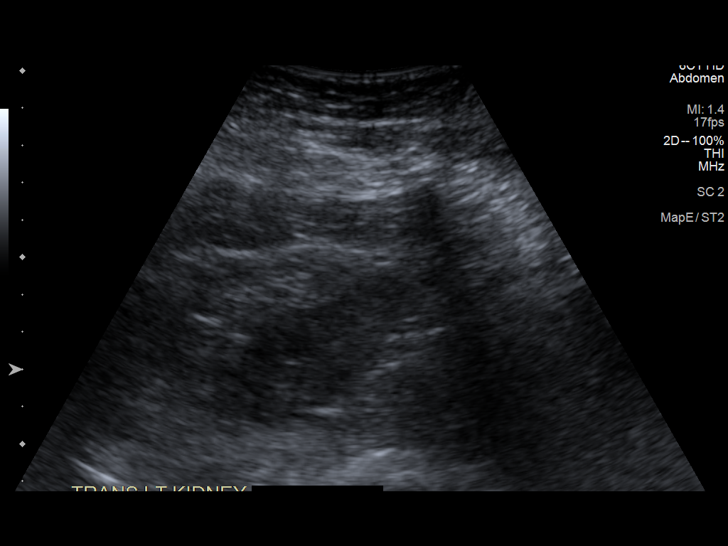
[im 101/101]
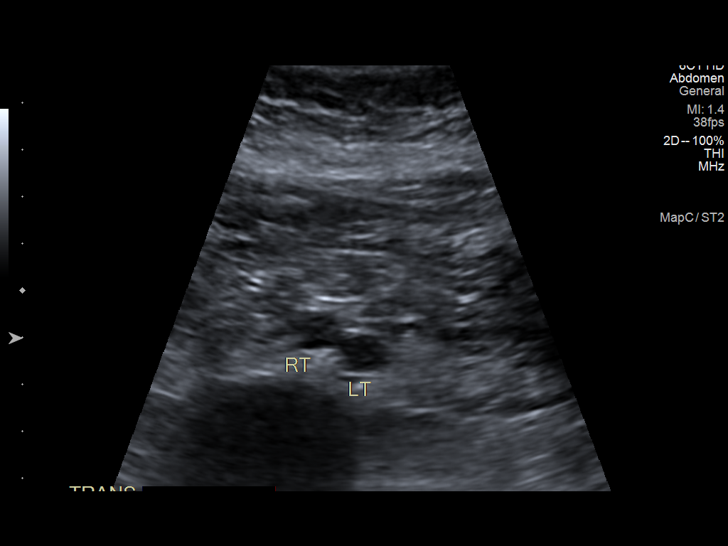

[14 of 25 positions shown; findings below may reference images not displayed]

FINDINGS: Gallbladder: Surgically absent.

Common bile duct: Diameter: 2 mm

Liver: Enlarged with coarse inhomogeneous increased echogenicity of
the parenchyma. No focal mass identified. Portal vein is patent on
color Doppler imaging with normal direction of blood flow towards
the liver.

IVC: No abnormality visualized.

Pancreas: Visualized portion unremarkable.

Spleen: Size and appearance within normal limits.

Right Kidney: Length: 10.7 cm. Echogenicity within normal limits. No
mass or hydronephrosis visualized.

Left Kidney: Length: 10.2 cm. Echogenicity within normal limits. No
mass or hydronephrosis visualized.

Abdominal aorta: No aneurysm visualized.

Other findings: None.
IMPRESSION: Hepatomegaly with abnormal echogenicity of the parenchyma suggesting
hepatic steatosis.

## 2023-09-12 NOTE — Progress Notes (Signed)
 38 y.o. [id number] female with Mirena  IUD (inserted June 2023) here for annual exam. Married.  No LMP recorded. (Menstrual status: IUD).   She reports concerns about testing for menopause. More HAs recently. Reports mother started going through perimenopause at 39yo, had hysterectomy at 39yo for AUB/endometriosis. Recently started lexapro for depression. Interested in hormonal testing. Positive SUI- cough, laugh, sneeze, intercourse.  Desires urogyn referral.  Urine sample provided: No  Abnormal bleeding: none Pelvic discharge or pain: none Breast mass, nipple discharge or skin changes : none  Sexually active:  Yes Birth control: IUD Last PAP:     Component Value Date/Time   DIAGPAP  09/10/2017 0000    -  NEGATIVE FOR INTRAEPITHELIAL LESIONS OR MALIGNANCY   DIAGPAP -  BENIGN REACTIVE/REPARATIVE CHANGES 09/10/2017 0000   ADEQPAP  09/10/2017 0000    Satisfactory for evaluation  endocervical/transformation zone component PRESENT.   Gardasil: unsure  Exercising: Yes, walking 2-3 times a week for 30 mins each Smoker: No  Flowsheet Row Office Visit from 09/15/2023 in [facility]  PHQ-2 Total Score 0    Flowsheet Row Office Visit from 08/03/2018 in [facility]  PHQ-9 Total Score 0     GYN HISTORY: No sig hx  OB History  Gravida Para Term Preterm AB Living  2 2 2  0 0 2  SAB IAB Ectopic Multiple Live Births  0 0 0 0 2    # Outcome Date GA Lbr Len/2nd Weight Sex Type Anes PTL Lv  2 Term      Vag-Spont   LIV  1 Term      Vag-Spont   LIV   Past Medical History:  Diagnosis Date   ADD (attention deficit disorder)    Anemia    Gestational diabetes    Incompetent cervix 08/01/2017   See MFM consult note-  reviewed history - pt with PTL sxs but had term delivery - has bicornuate uterus - not a cerclage candidate -would obtain serial cervical US       Last Assessment & Plan:   Relevant Hx:  Course:  Daily Update:  Today's  Plan:     Past Surgical History:  Procedure Laterality Date   BREAST SURGERY     CHOLECYSTECTOMY     Current Outpatient Medications on File Prior to Visit  Medication Sig Dispense Refill   amphetamine -dextroamphetamine  (ADDERALL  XR) 30 MG 24 hr capsule Take 1 capsule (30 mg total) by mouth daily. 30 capsule 0   escitalopram (LEXAPRO) 10 MG tablet Take 10 mg by mouth daily.     ibuprofen (IBU) 800 MG tablet Take 800 mg by mouth 3 (three) times daily as needed.     levonorgestrel  (MIRENA ) 20 MCG/24HR IUD 1 each by Intrauterine route once.     No current facility-administered medications on file prior to visit.   Social History   Socioeconomic History   Marital status: Married    Spouse name: Not on file   Number of children: Not on file   Years of education: Not on file   Highest education level: Not on file  Occupational History   Not on file  Tobacco Use   Smoking status: Never   Smokeless tobacco: Never  Vaping Use   Vaping status: Never Used  Substance and Sexual Activity   Alcohol use: No   Drug use: No   Sexual activity: Yes    Birth control/protection: I.U.D.    Comment: Mirena  placed in  2016  Other Topics Concern   Not on file  Social History Narrative   Not on file   Social Drivers of Health   Financial Resource Strain: Not on file  Food Insecurity: Not on file  Transportation Needs: Not on file  Physical Activity: Not on file  Stress: Not on file  Social Connections: Not on file  Intimate Partner Violence: Not on file   Family History  Problem Relation Age of Onset   Heart disease Father    Stroke Maternal Grandfather    Breast cancer Paternal Aunt    Allergies  Allergen Reactions   Codeine     Hallucinations/Vomiting    PE Today's Vitals   09/15/23 1330  BP: 120/78  Pulse: 67  Temp: 98.4 F (36.9 C)  TempSrc: Oral  SpO2: 98%  Weight: 208 lb (94.3 kg)  Height: 5' 3.39 (1.61 m)   Body mass index is 36.4 kg/m.  Physical Exam Vitals  reviewed. Exam conducted with a chaperone present.  Constitutional:      General: She is not in acute distress.    Appearance: Normal appearance.  HENT:     Head: Normocephalic and atraumatic.     Nose: Nose normal.  Eyes:     Extraocular Movements: Extraocular movements intact.     Conjunctiva/sclera: Conjunctivae normal.  Neck:     Thyroid: No thyroid mass, thyromegaly or thyroid tenderness.  Pulmonary:     Effort: Pulmonary effort is normal.  Chest:     Chest wall: No mass or tenderness.  Breasts:    Right: Normal. No swelling, mass, nipple discharge, skin change or tenderness.     Left: Normal. No swelling, mass, nipple discharge, skin change or tenderness.  Abdominal:     General: There is no distension.     Palpations: Abdomen is soft.     Tenderness: There is no abdominal tenderness.  Genitourinary:    General: Normal vulva.     Exam position: Lithotomy position.     Urethra: No prolapse.     Vagina: Normal. No vaginal discharge or bleeding.     Cervix: Normal. No cervical motion tenderness, discharge or lesion.     Uterus: Normal. Not enlarged and not tender.      Adnexa: Right adnexa normal and left adnexa normal.     Comments: IUD strings present. Musculoskeletal:        General: Normal range of motion.     Cervical back: Normal range of motion.  Lymphadenopathy:     Upper Body:     Right upper body: No axillary adenopathy.     Left upper body: No axillary adenopathy.     Lower Body: No right inguinal adenopathy. No left inguinal adenopathy.  Skin:    General: Skin is warm and dry.  Neurological:     General: No focal deficit present.     Mental Status: She is alert.  Psychiatric:        Mood and Affect: Mood normal.        Behavior: Behavior normal.       Assessment and Plan:        Well woman exam with routine gynecological exam Assessment & Plan: Cervical cancer screening performed according to ASCCP guidelines. Labs and immunizations with her  primary Encouraged safe sexual practices as indicated Encouraged healthy lifestyle practices with diet and exercise For patients under 50yo, I recommend 1000mg  calcium daily and 600IU of vitamin D  daily.    Cervical cancer screening -  Cytology - PAP  Negative depression screening  Perimenopause -     Follicle stimulating hormone -     Estradiol  Recommend keeping diary/log of HAs  SUI (stress urinary incontinence, female) -     Ambulatory referral to Urogynecology Recommend kegels  IUD (intrauterine device) in place Assessment & Plan: Continue    Vera LULLA Pa, MD

## 2023-09-15 ENCOUNTER — Ambulatory Visit (INDEPENDENT_AMBULATORY_CARE_PROVIDER_SITE_OTHER): Admitting: Obstetrics and Gynecology

## 2023-09-15 ENCOUNTER — Encounter: Payer: Self-pay | Admitting: Obstetrics and Gynecology

## 2023-09-15 ENCOUNTER — Other Ambulatory Visit (HOSPITAL_COMMUNITY)
Admission: RE | Admit: 2023-09-15 | Discharge: 2023-09-15 | Disposition: A | Discharge status: Home / Self Care | Source: Ambulatory Visit | Attending: Obstetrics and Gynecology | Admitting: Obstetrics and Gynecology

## 2023-09-15 VITALS — BP 120/78 | HR 67 | Temp 98.4°F | Ht 63.39 in | Wt 208.0 lb

## 2023-09-15 DIAGNOSIS — Z1331 Encounter for screening for depression: Secondary | ICD-10-CM | POA: Diagnosis not present

## 2023-09-15 DIAGNOSIS — Z01419 Encounter for gynecological examination (general) (routine) without abnormal findings: Secondary | ICD-10-CM | POA: Diagnosis not present

## 2023-09-15 DIAGNOSIS — Z975 Presence of (intrauterine) contraceptive device: Secondary | ICD-10-CM | POA: Insufficient documentation

## 2023-09-15 DIAGNOSIS — Z124 Encounter for screening for malignant neoplasm of cervix: Secondary | ICD-10-CM

## 2023-09-15 DIAGNOSIS — N951 Menopausal and female climacteric states: Secondary | ICD-10-CM

## 2023-09-15 DIAGNOSIS — N393 Stress incontinence (female) (male): Secondary | ICD-10-CM | POA: Insufficient documentation

## 2023-09-15 LAB — FOLLICLE STIMULATING HORMONE: FSH: 7.8 m[IU]/mL

## 2023-09-15 LAB — ESTRADIOL: Estradiol: 54 pg/mL

## 2023-09-15 NOTE — Assessment & Plan Note (Signed)
 Continue

## 2023-09-15 NOTE — Patient Instructions (Signed)

## 2023-09-15 NOTE — Assessment & Plan Note (Signed)
 Cervical cancer screening performed according to ASCCP guidelines. Labs and immunizations with her primary Encouraged safe sexual practices as indicated Encouraged healthy lifestyle practices with diet and exercise For patients under 39yo, I recommend 1000mg  calcium daily and 600IU of vitamin D daily.

## 2023-09-16 ENCOUNTER — Ambulatory Visit: Payer: Self-pay | Admitting: Obstetrics and Gynecology

## 2023-09-19 LAB — CYTOLOGY - PAP
Comment: NEGATIVE
Diagnosis: NEGATIVE
Diagnosis: REACTIVE
High risk HPV: NEGATIVE

## 2023-12-29 ENCOUNTER — Ambulatory Visit: Admitting: Obstetrics

## 2024-03-26 ENCOUNTER — Ambulatory Visit: Admitting: Obstetrics

## 2024-06-09 ENCOUNTER — Ambulatory Visit: Admitting: Obstetrics
